# Patient Record
Sex: Male | Born: 1940 | Race: Black or African American | Hispanic: No | Marital: Married | Smoking: Never smoker
Health system: Southern US, Community
[De-identification: ages and names within clinical notes are randomized; demographics above are authoritative.]

## PROBLEM LIST (undated history)

## (undated) DIAGNOSIS — N529 Male erectile dysfunction, unspecified: Secondary | ICD-10-CM

## (undated) DIAGNOSIS — Z950 Presence of cardiac pacemaker: Secondary | ICD-10-CM

## (undated) DIAGNOSIS — R001 Bradycardia, unspecified: Secondary | ICD-10-CM

## (undated) DIAGNOSIS — F329 Major depressive disorder, single episode, unspecified: Secondary | ICD-10-CM

## (undated) DIAGNOSIS — I517 Cardiomegaly: Secondary | ICD-10-CM

## (undated) DIAGNOSIS — I10 Essential (primary) hypertension: Secondary | ICD-10-CM

## (undated) DIAGNOSIS — F3289 Other specified depressive episodes: Secondary | ICD-10-CM

## (undated) DIAGNOSIS — N400 Enlarged prostate without lower urinary tract symptoms: Secondary | ICD-10-CM

## (undated) DIAGNOSIS — Z954 Presence of other heart-valve replacement: Secondary | ICD-10-CM

## (undated) DIAGNOSIS — Z9989 Dependence on other enabling machines and devices: Secondary | ICD-10-CM

## (undated) DIAGNOSIS — E669 Obesity, unspecified: Secondary | ICD-10-CM

## (undated) DIAGNOSIS — J309 Allergic rhinitis, unspecified: Secondary | ICD-10-CM

## (undated) DIAGNOSIS — E785 Hyperlipidemia, unspecified: Secondary | ICD-10-CM

## (undated) DIAGNOSIS — G4733 Obstructive sleep apnea (adult) (pediatric): Secondary | ICD-10-CM

## (undated) DIAGNOSIS — I4891 Unspecified atrial fibrillation: Secondary | ICD-10-CM

## (undated) DIAGNOSIS — R072 Precordial pain: Secondary | ICD-10-CM

## (undated) DIAGNOSIS — K635 Polyp of colon: Secondary | ICD-10-CM

## (undated) DIAGNOSIS — I2510 Atherosclerotic heart disease of native coronary artery without angina pectoris: Secondary | ICD-10-CM

## (undated) DIAGNOSIS — K219 Gastro-esophageal reflux disease without esophagitis: Secondary | ICD-10-CM

## (undated) DIAGNOSIS — E291 Testicular hypofunction: Secondary | ICD-10-CM

## (undated) DIAGNOSIS — M47812 Spondylosis without myelopathy or radiculopathy, cervical region: Secondary | ICD-10-CM

## (undated) DIAGNOSIS — D126 Benign neoplasm of colon, unspecified: Secondary | ICD-10-CM

## (undated) DIAGNOSIS — I441 Atrioventricular block, second degree: Secondary | ICD-10-CM

## (undated) DIAGNOSIS — IMO0001 Reserved for inherently not codable concepts without codable children: Secondary | ICD-10-CM

## (undated) DIAGNOSIS — G4730 Sleep apnea, unspecified: Secondary | ICD-10-CM

## (undated) HISTORY — DX: Dependence on other enabling machines and devices: Z99.89

## (undated) HISTORY — DX: Atrioventricular block, second degree: I44.1

## (undated) HISTORY — DX: Major depressive disorder, single episode, unspecified: F32.9

## (undated) HISTORY — DX: Gastro-esophageal reflux disease without esophagitis: K21.9

## (undated) HISTORY — PX: KNEE ARTHROSCOPY: SUR90

## (undated) HISTORY — DX: Benign neoplasm of colon, unspecified: D12.6

## (undated) HISTORY — DX: Cardiomegaly: I51.7

## (undated) HISTORY — DX: Hyperlipidemia, unspecified: E78.5

## (undated) HISTORY — DX: Obesity, unspecified: E66.9

## (undated) HISTORY — PX: OTHER SURGICAL HISTORY: SHX169

## (undated) HISTORY — DX: Bradycardia, unspecified: R00.1

## (undated) HISTORY — PX: CATARACT EXTRACTION: SUR2

## (undated) HISTORY — DX: Obstructive sleep apnea (adult) (pediatric): G47.33

## (undated) HISTORY — DX: Presence of cardiac pacemaker: Z95.0

## (undated) HISTORY — DX: Spondylosis without myelopathy or radiculopathy, cervical region: M47.812

## (undated) HISTORY — DX: Presence of other heart-valve replacement: Z95.4

## (undated) HISTORY — DX: Reserved for inherently not codable concepts without codable children: IMO0001

## (undated) HISTORY — DX: Sleep apnea, unspecified: G47.30

## (undated) HISTORY — DX: Unspecified atrial fibrillation: I48.91

## (undated) HISTORY — DX: Precordial pain: R07.2

## (undated) HISTORY — DX: Testicular hypofunction: E29.1

## (undated) HISTORY — DX: Atherosclerotic heart disease of native coronary artery without angina pectoris: I25.10

## (undated) HISTORY — DX: Other specified depressive episodes: F32.89

## (undated) HISTORY — DX: Allergic rhinitis, unspecified: J30.9

## (undated) HISTORY — DX: Male erectile dysfunction, unspecified: N52.9

## (undated) HISTORY — DX: Polyp of colon: K63.5

## (undated) HISTORY — DX: Benign prostatic hyperplasia without lower urinary tract symptoms: N40.0

---

## 1988-07-02 HISTORY — PX: CORONARY ARTERY BYPASS GRAFT: SHX141

## 1992-07-02 HISTORY — PX: CORONARY ARTERY BYPASS GRAFT: SHX141

## 1998-06-22 ENCOUNTER — Encounter: Payer: Self-pay | Admitting: Urology

## 1998-06-22 ENCOUNTER — Ambulatory Visit (HOSPITAL_COMMUNITY): Admission: RE | Admit: 1998-06-22 | Discharge: 1998-06-22 | Payer: Self-pay | Admitting: Urology

## 1999-02-22 ENCOUNTER — Ambulatory Visit (HOSPITAL_BASED_OUTPATIENT_CLINIC_OR_DEPARTMENT_OTHER): Admission: RE | Admit: 1999-02-22 | Discharge: 1999-02-22 | Payer: Self-pay | Admitting: Orthopedic Surgery

## 2000-07-08 ENCOUNTER — Ambulatory Visit (HOSPITAL_BASED_OUTPATIENT_CLINIC_OR_DEPARTMENT_OTHER): Admission: RE | Admit: 2000-07-08 | Discharge: 2000-07-08 | Payer: Self-pay | Admitting: Interventional Cardiology

## 2000-10-01 ENCOUNTER — Ambulatory Visit (HOSPITAL_BASED_OUTPATIENT_CLINIC_OR_DEPARTMENT_OTHER): Admission: RE | Admit: 2000-10-01 | Discharge: 2000-10-01 | Payer: Self-pay | Admitting: Internal Medicine

## 2002-10-17 ENCOUNTER — Emergency Department (HOSPITAL_COMMUNITY): Admission: EM | Admit: 2002-10-17 | Discharge: 2002-10-17 | Payer: Self-pay | Admitting: Emergency Medicine

## 2005-04-24 ENCOUNTER — Encounter: Admission: RE | Admit: 2005-04-24 | Discharge: 2005-04-24 | Payer: Self-pay | Admitting: Internal Medicine

## 2006-08-05 ENCOUNTER — Ambulatory Visit (HOSPITAL_COMMUNITY): Admission: RE | Admit: 2006-08-05 | Discharge: 2006-08-05 | Payer: Self-pay | Admitting: Orthopedic Surgery

## 2006-11-29 ENCOUNTER — Encounter: Admission: RE | Admit: 2006-11-29 | Discharge: 2006-11-29 | Payer: Self-pay | Admitting: Internal Medicine

## 2007-02-10 ENCOUNTER — Ambulatory Visit (HOSPITAL_COMMUNITY): Admission: RE | Admit: 2007-02-10 | Discharge: 2007-02-10 | Payer: Self-pay | Admitting: Internal Medicine

## 2007-07-03 DIAGNOSIS — D126 Benign neoplasm of colon, unspecified: Secondary | ICD-10-CM

## 2007-07-03 HISTORY — DX: Benign neoplasm of colon, unspecified: D12.6

## 2008-01-09 ENCOUNTER — Encounter: Admission: RE | Admit: 2008-01-09 | Discharge: 2008-01-09 | Payer: Self-pay | Admitting: Internal Medicine

## 2010-11-17 NOTE — Op Note (Signed)
NAME:  Brent Everett, Brent Everett                ACCOUNT NO.:  [id number]   MEDICAL RECORD NO.:  [id number]          PATIENT TYPE:  AMB   LOCATION:  SDS                          FACILITY:  [facility]   PHYSICIAN:  Harvie Junior, M.D.   DATE OF BIRTH:  [birth year]   DATE OF PROCEDURE:  DATE OF DISCHARGE:  08/05/2006                               OPERATIVE REPORT   PREOPERATIVE DIAGNOSIS:  Medial meniscal tear.   POSTOPERATIVE DIAGNOSES:  1. Medial meniscal tear.  2. 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  3. 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   PRINCIPAL PROCEDURE:  1. Partial posterior medial meniscectomy with corresponding      debridement in the medial femoral compartment and medial      compartment.  2. Debridement of lateral femoral condyle by way of chondroplasty.  3. Debridement of patellofemoral joint by way of chondroplasty.   SURGEON:  Harvie Junior, M.D.   ANESTHESIA:  General.   BRIEF HISTORY:  Mr. Gipe is a 70 year old male with a long history of  having had significant left knee problems.  He was ultimately with a  knee arthroscopy and he did well with this.  He began having right knee  symptoms.  We treated him conservatively with anti-inflammatory  medications, activity modifications, and injection therapy.  All of this  failed.  The patient continued to complain of pain.  He was ultimately  taken to the operating room for operative knee arthroscopy.  He has a  mechanical valve in place and because of this he needed to be  transferred off of Coumadin onto Lovenox preoperatively and this was  accomplished.  He was brought to the operating room for this procedure.   PROCEDURE:  The patient was brought to the operating room, and after  adequate anesthesia was obtained with a general anesthetic, the patient  was placed on the operating table, and the right leg was prepped and  draped in the usual sterile fashion.  Following this routine,  arthroscopic  examination of the knee revealed that there was obvious  posterior arm medial meniscal tear.  This was treated with a partial  posterior arm medial meniscectomy.  Once this was completed, a  probe  was used to probe the posterior meniscus.  The medial femoral condyle  was then debrided to smooth this out and there was some grade II  chondromalacia. Attention was turned over to the lateral compartment  where there was some grade II chondromalacia in the lateral femoral  compartment.  The lateral meniscus was within normal limits.  Back up to  the patellofemoral compartment, there was some grade II and III changes  in the patellofemoral as well as beneath the patella and also in the  trochlear.  At this point the knee was  scoped, thoroughly irrigated, suctioned, and dried off.  The osteal  portals were closed with a bandage.  A sterile compression dressing was  applied and the patient taken to the recovery room, where she was noted  to be in satisfactory condition.  The estimated blood loss for the  procedure was  none.      Harvie Junior, M.D.  Electronically Signed     JLG/MEDQ  D:  08/05/2006  T:  08/06/2006  Job:  [id number]

## 2010-12-05 ENCOUNTER — Inpatient Hospital Stay (HOSPITAL_COMMUNITY): Payer: Medicare Other

## 2010-12-05 ENCOUNTER — Inpatient Hospital Stay (HOSPITAL_COMMUNITY)
Admission: AD | Admit: 2010-12-05 | Discharge: 2010-12-08 | DRG: 244 | Disposition: A | Discharge status: Home / Self Care | Payer: Medicare Other | Source: Ambulatory Visit | Attending: Interventional Cardiology | Admitting: Interventional Cardiology

## 2010-12-05 DIAGNOSIS — G4733 Obstructive sleep apnea (adult) (pediatric): Secondary | ICD-10-CM | POA: Diagnosis present

## 2010-12-05 DIAGNOSIS — E669 Obesity, unspecified: Secondary | ICD-10-CM | POA: Diagnosis present

## 2010-12-05 DIAGNOSIS — I4891 Unspecified atrial fibrillation: Secondary | ICD-10-CM | POA: Diagnosis present

## 2010-12-05 DIAGNOSIS — Z7901 Long term (current) use of anticoagulants: Secondary | ICD-10-CM

## 2010-12-05 DIAGNOSIS — IMO0001 Reserved for inherently not codable concepts without codable children: Secondary | ICD-10-CM | POA: Diagnosis present

## 2010-12-05 DIAGNOSIS — R911 Solitary pulmonary nodule: Secondary | ICD-10-CM

## 2010-12-05 DIAGNOSIS — Z888 Allergy status to other drugs, medicaments and biological substances status: Secondary | ICD-10-CM

## 2010-12-05 DIAGNOSIS — Z882 Allergy status to sulfonamides status: Secondary | ICD-10-CM

## 2010-12-05 DIAGNOSIS — I441 Atrioventricular block, second degree: Principal | ICD-10-CM | POA: Diagnosis present

## 2010-12-05 DIAGNOSIS — Z954 Presence of other heart-valve replacement: Secondary | ICD-10-CM

## 2010-12-05 DIAGNOSIS — I10 Essential (primary) hypertension: Secondary | ICD-10-CM | POA: Diagnosis present

## 2010-12-05 HISTORY — DX: Essential (primary) hypertension: I10

## 2010-12-05 LAB — COMPREHENSIVE METABOLIC PANEL
AST: 18 U/L (ref 0–37)
BUN: 15 mg/dL (ref 6–23)
CO2: 28 mEq/L (ref 19–32)
Chloride: 106 mEq/L (ref 96–112)
Creatinine, Ser: 0.99 mg/dL (ref 0.4–1.5)
GFR calc non Af Amer: >60 mL/min (ref >60)
Glucose, Bld: 96 mg/dL (ref 70–99)
Total Bilirubin: 0.5 mg/dL (ref 0.3–1.2)

## 2010-12-05 LAB — PROTIME-INR
INR: 2.38 — ABNORMAL HIGH (ref 0.00–1.49)
Prothrombin Time: 26.1 seconds — ABNORMAL HIGH (ref 11.6–15.2)

## 2010-12-05 LAB — CBC
Platelets: 158 10*3/uL (ref 150–400)
RBC: 4.82 MIL/uL (ref 4.22–5.81)
WBC: 5.5 10*3/uL (ref 4.0–10.5)

## 2010-12-05 LAB — CARDIAC PANEL(CRET KIN+CKTOT+MB+TROPI): Troponin I: <0.3 ng/mL (ref <0.30)

## 2010-12-05 LAB — PRO B NATRIURETIC PEPTIDE: Pro B Natriuretic peptide (BNP): 177.8 pg/mL — ABNORMAL HIGH (ref 0–125)

## 2010-12-06 ENCOUNTER — Inpatient Hospital Stay (HOSPITAL_COMMUNITY): Payer: Medicare Other

## 2010-12-06 ENCOUNTER — Encounter (HOSPITAL_COMMUNITY): Payer: Self-pay | Admitting: Radiology

## 2010-12-06 DIAGNOSIS — I441 Atrioventricular block, second degree: Secondary | ICD-10-CM

## 2010-12-06 LAB — PROTIME-INR
INR: 2.43 — ABNORMAL HIGH (ref 0.00–1.49)
Prothrombin Time: 26.5 seconds — ABNORMAL HIGH (ref 11.6–15.2)

## 2010-12-06 MED ORDER — IOHEXOL 350 MG/ML SOLN
100.0000 mL | Freq: Once | INTRAVENOUS | Status: AC | PRN
  Administered 2010-12-06: 100 mL via INTRAVENOUS

## 2010-12-07 DIAGNOSIS — I442 Atrioventricular block, complete: Secondary | ICD-10-CM

## 2010-12-07 DIAGNOSIS — Z950 Presence of cardiac pacemaker: Secondary | ICD-10-CM

## 2010-12-07 HISTORY — PX: PACEMAKER INSERTION: SHX728

## 2010-12-07 HISTORY — DX: Presence of cardiac pacemaker: Z95.0

## 2010-12-07 LAB — BASIC METABOLIC PANEL
CO2: 29 mEq/L (ref 19–32)
Chloride: 104 mEq/L (ref 96–112)
Creatinine, Ser: 0.91 mg/dL (ref 0.4–1.5)
GFR calc Af Amer: >60 mL/min (ref >60)

## 2010-12-08 ENCOUNTER — Inpatient Hospital Stay (HOSPITAL_COMMUNITY): Payer: Medicare Other

## 2010-12-08 LAB — BASIC METABOLIC PANEL
GFR calc Af Amer: >60 mL/min (ref >60)
GFR calc non Af Amer: >60 mL/min (ref >60)
Glucose, Bld: 115 mg/dL — ABNORMAL HIGH (ref 70–99)
Potassium: 3.6 mEq/L (ref 3.5–5.1)
Sodium: 141 mEq/L (ref 135–145)

## 2010-12-08 LAB — PROTIME-INR: INR: 1.62 — ABNORMAL HIGH (ref 0.00–1.49)

## 2010-12-12 ENCOUNTER — Other Ambulatory Visit (HOSPITAL_COMMUNITY): Payer: Self-pay | Admitting: Interventional Cardiology

## 2010-12-12 DIAGNOSIS — I7100 Dissection of unspecified site of aorta: Secondary | ICD-10-CM

## 2010-12-21 ENCOUNTER — Ambulatory Visit: Payer: Medicare Other | Admitting: *Deleted

## 2011-01-02 NOTE — Consult Note (Signed)
Brent Everett                ACCOUNT NO.:  [id number]  MEDICAL RECORD NO.:  [id number]  LOCATION:  4709                         FACILITY:  [facility]  PHYSICIAN:  Duke Salvia, MD, FACCDATE OF BIRTH:  02/10/1941  DATE OF CONSULTATION:  12/06/2010 DATE OF DISCHARGE:                                CONSULTATION   Thank you very much for asking [place] to see [name] in consultation for heart block.  He is a morbidly obese 70 year old gentleman with a history of hypertension, obstructive sleep apnea, and aortic dissection for unclear reasons that initially occurred in 1990, and he underwent aortic valve replacement and presumed repair, although these records are not available in 1994.  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  This was apparently confirmed again today.  He had been doing well.  He had seen his primary cardiologist Dr. Katrinka Blazing earlier this week and was found to be in heart block.  He was on carvedilol for blood pressure and because of dissection, and he is admitted to hospital.  His carvedilol was discontinued.  His heart block improved, but has persisted moderately with second-degree heart block and prolonged pauses today.  We were asked to see him to consider pacing given the need for chronic beta-blocker therapy given his dissection.  Previously, normal left ventricular function and that was apparently confirmed today, although the records again are not available.  He has noted over the last couple of weeks episodes of lightheadedness and impaired exercise tolerance.  He has had no peripheral edema.  His past medical history in addition to the above is notable for hypertension, obstructive sleep apnea, depression.  His review of systems is as noted previously.  His past surgical  history is as noted above.  In addition, he has had surgery on his right knee.  SOCIAL HISTORY:  He is married.  He has three children.  He does not use cigarettes or alcohol, uses some marijuana.  PHYSICAL EXAMINATION:  GENERAL:  He is an older African American gentleman appeared his stated age of 53. VITAL SIGNS:  His blood pressure is elevated at 153/107, his pulse is 70.  He is afebrile.  He is in no acute distress. HEENT:  Normal. NECK:  Veins were flat.  His carotids were brisk and full bilaterally without bruits.  The mechanical S2 was audible. BACK:  Without kyphosis or scoliosis. LUNGS:  Clear. HEART:  Sounds were regular with a 2/6 systolic murmur that varied with RR intervals.  There was mechanical S2. ABDOMEN:  Protuberant but soft. EXTREMITIES:  Femoral pulses were not examined.  Distal pulses were trace.  There was no clubbing, cyanosis, or edema. NEUROLOGIC:  Grossly normal. SKIN:  Warm and dry.  Electrocardiogram dated this morning demonstrated sinus rhythm at 69 with intervals of 0.24/0.10/0.37.  There is poor R-wave progression and evidence of septal infarct.  Electrocardiogram from yesterday demonstrated a ventricular escape rhythm at approximately 35 beats per minute with retrograde VA conduction.  There was an isolated sinus beat interpolated  there although may have been a fusion beat retrospectively with a right bundle- branch block escape beat.  Other laboratories notable for an INR of 2.43, a TSH that was normal, cardiac enzymes that were negative. Electrolytes were notable for only a mild depletion of potassium at 3.4. His BNP was mildly elevated at 177.  This may be artifactually low given his obesity.  IMPRESSION: 1. High-grade heart block with some improvement following     discontinuation of his carvedilol. 2. History of aortic valve replacement and aortic dissection with     chronic dissection requiring ongoing beta-blocker therapy. 3. Report by  imaging of bypass surgery, although these records are not     available. 4. Presumed normal left ventricular function, records pending. 5. Obstructive sleep apnea. 6. Morbid obesity. 7. Resistant hypertension. 8. Marijuana use.  Brent Everett required pacing because of his need for beta-blocker therapy. I have reviewed with him the potential benefits and risks including, but not limited to death, perforation, infection, and lead dislodgement.  He understands these risks and is willing to proceed.  In addition, I have suggested that he consider refraining from marijuana use.  It is associated with sympathetic stimulation, which is potentially problematic and with his history of aortic dissection.  The other issue I wonder is given the young age, which is aortic dissection presented is whether reevaluation as to the cause of his aortopathy is worth considering.  It may just be hypertension.  We will plan to undertake pacing tomorrow; this is dictated at 1700 hours.  We are going to do it earlier today, but he ended up having need for urgent CT scanning.     Duke Salvia, MD, [facility]     SCK/MEDQ  D:  12/06/2010  T:  12/07/2010  Job:  [id number]  Electronically Signed by Sherryl Manges MD [facility] on 01/02/2011 04:30:42 PM

## 2011-01-02 NOTE — Op Note (Signed)
  NAMESTEPHENS, SHREVE                ACCOUNT NO.:  [id number]  MEDICAL RECORD NO.:  [id number]  LOCATION:  4709                         FACILITY:  [facility]  PHYSICIAN:  Duke Salvia, MD, FACCDATE OF BIRTH:  Mar 11, 1941  DATE OF PROCEDURE:  12/07/2010 DATE OF DISCHARGE:                              OPERATIVE REPORT   PREOPERATIVE DIAGNOSIS:  Complete heart block.  POSTOPERATIVE DIAGNOSIS:  Complete heart block.  PROCEDURE:  Dual-chamber pacemaker implantation.  Following obtaining informed consent, the patient was brought to the electrophysiology laboratory and placed on the fluoroscopic table in supine position.  After routine prep and drape of the left upper chest, lidocaine was infiltrated in the prepectoral subclavicular region and carried down to the layer of the prepectoral fascia using electrocautery and sharp dissection.  A pocket was formed similarly.  Hemostasis was obtained.  Thereafter, attention was turned to gaining access to the extrathoracic left subclavian vein which was accomplished without difficulty without the aspiration of air or puncture of the artery.  Two separate venipunctures were accomplished.  Guidewires were placed and retained and sequentially 7-French sheaths were placed which were passed a St. Jude 2088 TC, 58-cm active-fixation ventricular lead, serial number [id number] and a 2088 TC, 52-cm active-fixation atrial lead, serial number [id number].  Under fluoroscopic guidance, these were manipulated to the right ventricular apex and the right atrial appendage respectively where the bipolar R-wave was 19.6 with a pace impedance of 540, threshold of 1 V at 0.4, current threshold is 1.8 MA.  There was no diaphragmatic pacing at 10 V and the current of injury was modest.  Bipolar P-wave was 3.4 with a pace impedance of 431, a threshold of 1.5 at 0.4, current of threshold was 3.4 mA and a current of injury was modest.  There was no diaphragmatic pacing  at 10 volts.  The ventricular lead was marked with a tie prior to inserting the atrial lead.  These leads were secured to the prepectoral fascia and then attached to a St. Jude Accent DRRF pulse generator, model number PM 2210, serial number [id number].  Parameters were checked remotely via the wireless system and they were stable.  The leads of the pulse generator were placed in the pocket and secured to the prepectoral fascia.  The wound was closed in 3 layers in normal fashion.  The wound was washed, dried, and Benzoin and Steri-Strip dressing was applied.  Needle counts, sponge counts, and instrument counts were correct at the end of the procedure according to the staff.  The patient tolerated the procedure without apparent complication.     Duke Salvia, MD, [facility]     SCK/MEDQ  D:  12/07/2010  T:  12/08/2010  Job:  [id number]  cc:   Lyn Records, M.D.  Electronically Signed by Sherryl Manges MD [facility] on 01/02/2011 04:30:45 PM

## 2011-01-04 NOTE — Discharge Summary (Signed)
NAMESHOUA, RESSLER                ACCOUNT NO.:  [id number]  MEDICAL RECORD NO.:  [id number]  LOCATION:  4709                         FACILITY:  [facility]  PHYSICIAN:  Lyn Records, M.D.   DATE OF BIRTH:  1941-02-09  DATE OF ADMISSION:  12/05/2010 DATE OF DISCHARGE:  12/08/2010                              DISCHARGE SUMMARY   REASON FOR ADMISSION:  Transient episodes of high-grade AV block, asymptomatic.  DISCHARGE DIAGNOSES: 1. 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     a.     DDD pacemaker implantation, December 07, 2010, Dr. Sherryl Manges. 2. History of mechanical aortic valve placement at the time of aortic     root replacement during acute aortic dissection. 3. Aortic dissection prior to 2000. 4. Obesity. 5. Sleep apnea. 6. History of atrial dysrhythmia with frequent premature atrial     contractions and atrial bigeminy. 7. Chronic anticoagulation therapy for aortic valve. 8. Chest pain on admission and felt to represent musculoskeletal pain.  PROCEDURES PERFORMED:  DDD pacemaker, December 07, 2010.  Chest and abdomen CT angio, December 06, 2010.  A 2-D Doppler echocardiogram, December 06, 2010.  DISCHARGE INSTRUCTIONS: 1. Follow up with Dr. Verdis Prime on December 27, 2010, at 9:00 a.m. [facility]     Cardiology. 2. Dr. Sherryl Manges in 2 months for exercise treadmill test and     pacemaker reprogramming.  MEDICATIONS: 1. Carvedilol 25 mg b.i.d. 2. Cymbalta 60 mg daily. 3. Diovan 320 mg daily. 4. Doxazosin 80 mg daily. 5. Eplerenone 25 mg daily. 6. Finasteride 5 mg daily. 7. Hydrochlorothiazide 25 mg daily. 8. Lipitor 40 mg daily. 9. Norvasc 10 mg daily. 10.Potassium chloride 20 mEq daily. 11.Prilosec 20 mg daily. 12.Soma 350 mg 3 times a day as needed for pain. 13.Warfarin 5 mg tablets with current schedule of 10 mg daily except     7.5 mg on Sunday, Monday, Tuesday, Wednesday, Friday and Saturday;     or in other words 10 mg is taken only on Thursdays. I have  requested that he limit the amount of Motrin that is used.  ACTIVITIES:  Per the instruction of Dr. Graciela Husbands with reference to arm movement until pacemaker leads are secure.  He is cautioned to call if chills, fever, or swelling at the pacemaker insertion site.  HISTORY AND PHYSICAL AND HOSPITAL COURSE:  Please see the office history and physical.  The patient was seen in consultation by Dr. Sherryl Manges.  He agreed that the patient had second-degree heart block and at times complete heart block, although he was asymptomatic.  The patient accepted proceeding with DDD pacemaker insertion which was performed on December 07, 2010.  Because of some vague chest pain on admission, a chest CT and abdominal CT were performed to rule out change in aortic anatomy given the patient's history of dissection.  No significant change was noted with the exception of a right lung nodule.  The right lung nodule will need to be followed up with CT in 4-6 months.  I have discussed this with the patient.  Please note that THE PATIENT HAS A RIGHT LUNG LOWER LOBE GROUND-GLASS NODULE THAT NEEDS FOLLOW UP IN  4-6 MONTHS.     Lyn Records, M.D.     HWS/MEDQ  D:  12/08/2010  T:  12/09/2010  Job:  [id number]  cc:   Duke Salvia, MD, [facility]  Electronically Signed by Verdis Prime M.D. on 01/04/2011 01:36:30 PM

## 2011-04-16 LAB — ALDOSTERONE + RENIN ACTIVITY W/ RATIO: Renin Activity: 0.5

## 2011-04-16 LAB — BASIC METABOLIC PANEL
BUN: 11
BUN: 13
CO2: 31
Calcium: 9.5
Calcium: 9.9
Chloride: 110
Creatinine, Ser: 0.73
Creatinine, Ser: 0.89
GFR calc non Af Amer: >60
Glucose, Bld: 89

## 2011-05-15 ENCOUNTER — Ambulatory Visit (HOSPITAL_COMMUNITY): Payer: Medicare Other

## 2012-11-03 ENCOUNTER — Other Ambulatory Visit: Payer: Self-pay | Admitting: Interventional Cardiology

## 2012-11-03 DIAGNOSIS — R072 Precordial pain: Secondary | ICD-10-CM

## 2012-11-03 DIAGNOSIS — I712 Thoracic aortic aneurysm, without rupture, unspecified: Secondary | ICD-10-CM

## 2012-11-03 DIAGNOSIS — I2510 Atherosclerotic heart disease of native coronary artery without angina pectoris: Secondary | ICD-10-CM

## 2012-11-04 ENCOUNTER — Ambulatory Visit
Admission: RE | Admit: 2012-11-04 | Discharge: 2012-11-04 | Disposition: A | Discharge status: Home / Self Care | Payer: Medicare Other | Source: Ambulatory Visit | Attending: Interventional Cardiology | Admitting: Interventional Cardiology

## 2012-11-04 DIAGNOSIS — I2510 Atherosclerotic heart disease of native coronary artery without angina pectoris: Secondary | ICD-10-CM

## 2012-11-04 DIAGNOSIS — R072 Precordial pain: Secondary | ICD-10-CM

## 2012-11-04 DIAGNOSIS — I712 Thoracic aortic aneurysm, without rupture, unspecified: Secondary | ICD-10-CM

## 2012-11-04 MED ORDER — IOHEXOL 350 MG/ML SOLN
80.0000 mL | Freq: Once | INTRAVENOUS | Status: AC | PRN
  Administered 2012-11-04: 80 mL via INTRAVENOUS

## 2013-04-13 ENCOUNTER — Other Ambulatory Visit: Payer: Self-pay | Admitting: Interventional Cardiology

## 2013-05-13 ENCOUNTER — Telehealth: Payer: Self-pay | Admitting: Interventional Cardiology

## 2013-05-13 NOTE — Telephone Encounter (Signed)
New message    For Brent Everett PCP presc vimovo 500/20 for neck stiffness possible arthritis----Ca he take this with his coumadin?

## 2013-05-13 NOTE — Telephone Encounter (Signed)
Patient instructed to avoid NSAID medications while one Coumadin.  Vimovo has naproxen in it.  He will speak with his PCP about something else, for instance tramadol.  He gets his coumadin checked in [place] where he sees Dr. Cheron Schaumann.  He had INR checked recently and it was therapeutic.

## 2013-05-18 ENCOUNTER — Other Ambulatory Visit: Payer: Self-pay | Admitting: Interventional Cardiology

## 2013-05-21 ENCOUNTER — Telehealth: Payer: Self-pay | Admitting: *Deleted

## 2013-05-21 NOTE — Telephone Encounter (Signed)
Pt calling to request reminder when next appt with Dr. Katrinka Blazing is. He is aware we will mail him a reminder to call and make an appointment 2 months prior to appt. Due date [name] RN

## 2013-05-28 ENCOUNTER — Other Ambulatory Visit: Payer: Self-pay | Admitting: Interventional Cardiology

## 2013-05-31 ENCOUNTER — Other Ambulatory Visit: Payer: Self-pay | Admitting: Interventional Cardiology

## 2013-06-01 ENCOUNTER — Telehealth: Payer: Self-pay | Admitting: Interventional Cardiology

## 2013-06-01 MED ORDER — WARFARIN SODIUM 5 MG PO TABS: ORAL_TABLET | ORAL | Status: DC

## 2013-06-01 NOTE — Telephone Encounter (Signed)
New message     Want Brent Everett to know he is no longer seeing PCP in [place] and he is out of coumadin.  The pharmacy will be calling us to refill his coumadin.  Until further notice, he will be coming back here to get his PT/INR.  He want to start seeing Dr Kirby Funk at [facility] but do not know if he will take him.  He said he will explain everything when he sees you.

## 2013-06-01 NOTE — Telephone Encounter (Signed)
Last INR was a few weeks ago at PCP (2.8).  He is stable on warfarin 7.5 mg qd (using 5 mg pills).  He will see me in 2 weeks.  Refill for warfarin sent to pharmacy. To Dr. Katrinka Blazing as [place] Childes.

## 2013-06-16 ENCOUNTER — Other Ambulatory Visit: Payer: Self-pay | Admitting: Interventional Cardiology

## 2013-06-18 ENCOUNTER — Ambulatory Visit (INDEPENDENT_AMBULATORY_CARE_PROVIDER_SITE_OTHER): Payer: Medicare Other | Admitting: *Deleted

## 2013-06-18 ENCOUNTER — Encounter (INDEPENDENT_AMBULATORY_CARE_PROVIDER_SITE_OTHER): Payer: Self-pay

## 2013-06-18 DIAGNOSIS — Z7901 Long term (current) use of anticoagulants: Secondary | ICD-10-CM

## 2013-06-18 DIAGNOSIS — Z954 Presence of other heart-valve replacement: Secondary | ICD-10-CM

## 2013-06-18 DIAGNOSIS — Z952 Presence of prosthetic heart valve: Secondary | ICD-10-CM | POA: Insufficient documentation

## 2013-06-18 DIAGNOSIS — I4891 Unspecified atrial fibrillation: Secondary | ICD-10-CM

## 2013-06-22 ENCOUNTER — Telehealth: Payer: Self-pay

## 2013-07-01 NOTE — Telephone Encounter (Signed)
refill 

## 2013-07-14 ENCOUNTER — Telehealth: Payer: Self-pay | Admitting: Interventional Cardiology

## 2013-07-14 MED ORDER — AMOXICILLIN 500 MG PO CAPS: 500.0000 mg | ORAL_CAPSULE | Freq: Every day | ORAL | Status: DC

## 2013-07-14 NOTE — Telephone Encounter (Signed)
New message     Have dentist appt this afternoon---will he need to be pre-medicated?-----If yes  [facility]   [phone].  Pls call pt and let him know yes or no.

## 2013-07-14 NOTE — Telephone Encounter (Signed)
returned pt call.pt given Brent Rubin G.,NP instructions to tale 2g of amoxicillin today before dental appt.pt verbalized understanding and agreeable with plan

## 2013-07-21 ENCOUNTER — Telehealth: Payer: Self-pay | Admitting: Interventional Cardiology

## 2013-07-21 NOTE — Telephone Encounter (Signed)
New message   Patient calling has question regarding medication eplerenone 25 mg making sure he not taken for CHF. Only blood pressure. Please advise.

## 2013-07-21 NOTE — Telephone Encounter (Signed)
Follow Up:  Pt is still waiting on a call from [place]. Pt states he needs to know about a medication. States it is for possible insurance coverage.

## 2013-07-22 NOTE — Telephone Encounter (Signed)
lmom.pt prescribed Inspra under htn diagnosis.pt can call back with any additional questions.

## 2013-07-22 NOTE — Telephone Encounter (Signed)
Follow up    Please fax note staying pt is taking eplerenone for bp only and not for chf---fax to [phone]  Attn mr holt.  This is for life ins purposes

## 2013-07-23 NOTE — Telephone Encounter (Signed)
Pt was told dr/cma are out of office today and will return tomorrow. Pt needs a call back. Pt was agreeable to plan.

## 2013-07-23 NOTE — Telephone Encounter (Signed)
Follow up     Want to know if Dr Tamala Julian has faxed the note regarding eplerenone.  Pls call pt

## 2013-07-23 NOTE — Telephone Encounter (Signed)
He is taking Inspra /Eplerenone for control of BP and hypokalemia.

## 2013-07-24 ENCOUNTER — Telehealth: Payer: Self-pay | Admitting: *Deleted

## 2013-07-24 NOTE — Telephone Encounter (Signed)
Do you know anything about a note that Dr Tamala Julian was to sign for this patient about some of his meds. Please fax to [phone] as soon as possible. Thanks, MI

## 2013-07-27 ENCOUNTER — Telehealth: Payer: Self-pay | Admitting: *Deleted

## 2013-07-27 NOTE — Telephone Encounter (Signed)
tried to return call to Hassan Rowan from pt ins.letter faxed to fax [phone] transmission ok

## 2013-07-27 NOTE — Telephone Encounter (Signed)
Hassan Rowan called again about the letter that was going to be written and faxed in for the patient. If you need to call her for any reason she can be reached at [phone]. Thanks, MI

## 2013-07-30 ENCOUNTER — Ambulatory Visit (INDEPENDENT_AMBULATORY_CARE_PROVIDER_SITE_OTHER): Payer: Medicare Other | Admitting: Internal Medicine

## 2013-07-30 ENCOUNTER — Ambulatory Visit (INDEPENDENT_AMBULATORY_CARE_PROVIDER_SITE_OTHER): Payer: Medicare Other | Admitting: Pharmacist

## 2013-07-30 ENCOUNTER — Encounter: Payer: Self-pay | Admitting: Internal Medicine

## 2013-07-30 VITALS — BP 138/86 | HR 77 | Ht 73.0 in | Wt 308.6 lb

## 2013-07-30 DIAGNOSIS — I4891 Unspecified atrial fibrillation: Secondary | ICD-10-CM

## 2013-07-30 DIAGNOSIS — Z954 Presence of other heart-valve replacement: Secondary | ICD-10-CM

## 2013-07-30 DIAGNOSIS — Z5181 Encounter for therapeutic drug level monitoring: Secondary | ICD-10-CM

## 2013-07-30 DIAGNOSIS — Z950 Presence of cardiac pacemaker: Secondary | ICD-10-CM

## 2013-07-30 DIAGNOSIS — Z7901 Long term (current) use of anticoagulants: Secondary | ICD-10-CM

## 2013-07-30 LAB — MDC_IDC_ENUM_SESS_TYPE_INCLINIC
Battery Remaining Longevity: 93.6 mo
Brady Statistic RV Percent Paced: 99 %
Date Time Interrogation Session: 20150129161833
Implantable Pulse Generator Serial Number: 7255840
Lead Channel Impedance Value: 400 Ohm
Lead Channel Pacing Threshold Amplitude: 0.875 V
Lead Channel Sensing Intrinsic Amplitude: 11.8 mV
Lead Channel Setting Sensing Sensitivity: 2 mV
MDC IDC MSMT BATTERY VOLTAGE: 2.95 V
MDC IDC MSMT LEADCHNL RA IMPEDANCE VALUE: 400 Ohm
MDC IDC MSMT LEADCHNL RA SENSING INTR AMPL: 1.4 mV
MDC IDC MSMT LEADCHNL RV PACING THRESHOLD PULSEWIDTH: 0.4 ms
MDC IDC SET LEADCHNL RA PACING AMPLITUDE: 2 V
MDC IDC SET LEADCHNL RV PACING AMPLITUDE: 1.125
MDC IDC SET LEADCHNL RV PACING PULSEWIDTH: 0.4 ms
MDC IDC STAT BRADY RA PERCENT PACED: 0.02 %

## 2013-07-30 LAB — POCT INR: INR: 3.9

## 2013-07-30 NOTE — Progress Notes (Signed)
HPI  [name] is referred today by Dr. Tamala Julian for ongoing evaluation and management of his PPM. The patient is a very pleasant 73 year old man with multiple medical problems. In 1990, he sustained an aortic dissection and underwent emergency surgery. In 1994, he underwent bypass surgery.  In 2012, he underwent insertion of a dual-chamber pacemaker secondary to symptomatic bradycardia do to sinus node dysfunction and  High-grade heart block. The patient is now developed atrial fibrillation with a very controlled ventricular response. He is pacing the ventricle 99% of the time. He denies syncope. He is a very sedentary lifestyle, and watches TV most of the day. He is not exercising, but is thinking about starting back exercising. He has shortness of breath when he bends over. He admits to dietary indiscretion  And has gained over 100 pounds. Allergies  Allergen Reactions  . Codeine Itching  . Lotensin [Benazepril Hcl]   . Other     Vivax  . Sulfa Antibiotics      Current Outpatient Prescriptions  Medication Sig Dispense Refill  . amLODipine (NORVASC) 10 MG tablet Take 10 mg by mouth daily.      Marland Kitchen atorvastatin (LIPITOR) 40 MG tablet Take 40 mg by mouth daily.      . carvedilol (COREG) 25 MG tablet TAKE 1 TABLET BY MOUTH TWICE A DAY  60 tablet  6  . doxazosin (CARDURA) 8 MG tablet Take 8 mg by mouth daily.      . DULoxetine (CYMBALTA) 60 MG capsule Take 60 mg by mouth daily.      Marland Kitchen eplerenone (INSPRA) 25 MG tablet take 1 tablet by mouth once daily  30 tablet  5  . furosemide (LASIX) 40 MG tablet take 1 tablet by mouth once daily  90 tablet  1  . omeprazole (PRILOSEC OTC) 20 MG tablet Take 20 mg by mouth as needed.      . potassium chloride SA (K-DUR,KLOR-CON) 20 MEQ tablet TAKE 1 TABLET BY MOUTH TWICE A DAY  60 tablet  5  . valsartan (DIOVAN) 320 MG tablet Take 320 mg by mouth daily.      Marland Kitchen warfarin (COUMADIN) 5 MG tablet Take as directed by [facility].       No current  facility-administered medications for this visit.     Past Medical History  Diagnosis Date  . Hypertension   . Heart valve replaced by other means     Normal function  . Obesity, unspecified   . Depressive disorder, not elsewhere classified   . Allergic rhinitis   . Hyperlipidemia   . Esophageal reflux   . Unspecified sleep apnea   . Hypogonadism male   . ED (erectile dysfunction)   . DJD (degenerative joint disease) of cervical spine   . BPH (benign prostatic hyperplasia)   . Colon polyp   . Bradycardia     Severe  . 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 Caryl Comes  . Thoracic aneurysm   . Precordial pain     R/O aortic aneurysm enlargement  . Coronary atherosclerosis of native coronary artery     Stable w/out angina  . CAD (coronary artery disease)     Signs of mild volume overload  . OSA on CPAP   . Colon adenoma 2009  . Second degree AV block     With asymptomatic severe bradycardia  . Atrial fibrillation     Asymptomatic. Continuous A fib on pacer check 10/02/11.  Marland Kitchen  Atrial enlargement, left     Severe, by ECHO 12/31/11  . Right atrial enlargement     Moderate, by ECHO 12/31/11    ROS:   All systems reviewed and negative except as noted in the HPI.   Past Surgical History  Procedure Laterality Date  . Pacemaker insertion  12/07/10    DDD pacemaker, Dr. Caryl Comes  . Other surgical history      Aortic dissection with repair, AV resuspension, and CABG to LAD and RCA in 1990. AVR with St. Jude and LIMA to LAD in 1994.  . Coronary artery bypass graft  1990    LAD to RCA   . Coronary artery bypass graft  1994    AVR with St. Jude and LIMA to LAD     Family History  Problem Relation Age of Onset  . Cancer Mother     pancreatic  . Cancer Maternal Grandmother   . Heart attack Maternal Grandfather   . Heart attack Paternal Grandfather   . Migraines Daughter      History   Social History  . Marital Status: Married    Spouse Name: N/A    Number of  Children: N/A  . Years of Education: N/A   Occupational History  . Not on file.   Social History Main Topics  . Smoking status: Never Smoker   . Smokeless tobacco: Not on file  . Alcohol Use: Yes     Comment: Occasional, one fifth of liquor last 1.5 months, beer infrequently  . Drug Use: Not on file  . Sexual Activity: Not on file   Other Topics Concern  . Not on file   Social History Narrative  . No narrative on file     BP 138/86  Pulse 77  Ht 6\' 1"  (1.854 m)  Wt 308 lb 9.6 oz (139.98 kg)  BMI 40.72 kg/m2  Physical Exam:  Well appearing NAD HEENT: Unremarkable Neck:  No JVD, no thyromegally Back:  No CVA tenderness Lungs:  Clear with no wheezes, rales, or rhonchi. HEART:  Regular rate rhythm, 2/6 systolic murmurs, no rubs, no clicks, mechanical S2 Abd:  soft, positive bowel sounds, no organomegally, no rebound, no guarding Ext:  2 plus pulses, no edema, no cyanosis, no clubbing Skin:  No rashes no nodules Neuro:  CN II through XII intact, motor grossly intact  DEVICE  Normal device function.  See PaceArt for details.   Assess/Plan:

## 2013-07-30 NOTE — Assessment & Plan Note (Signed)
His St. Jude dual-chamber pacemaker is working normally, and we will followup in several months.

## 2013-07-30 NOTE — Assessment & Plan Note (Signed)
He is anticoagulated and his ventricular rate is well controlled secondary to underlying heart block. No change in medical therapy.

## 2013-08-03 NOTE — Telephone Encounter (Signed)
The letter dated 07-24-13 needs to have more info, needs added that  Brent Everett has not been diagnosed or treated for CHF  And resend letter fax  Att Brent. Ernestine Mcmurray [phone] or phone [phone]

## 2013-08-04 NOTE — Telephone Encounter (Signed)
Brent Everett office calling re request for additional info needed on 07-24-13 letter, needs this asap, can it be done today??

## 2013-08-04 NOTE — Telephone Encounter (Signed)
I called Mr Brent Everett office/ they need additional statement added as stated below ASAP, made them aware he will be back in office Thursday 08/06/13. They are requesting it be done that morning so the insurance can be reversed. I told her that I will forward to Dr/Cma.

## 2013-08-06 NOTE — Telephone Encounter (Signed)
Letter faxed attn: Mr.Mejan fax [phone]

## 2013-08-17 ENCOUNTER — Encounter: Payer: Self-pay | Admitting: Interventional Cardiology

## 2013-08-26 ENCOUNTER — Ambulatory Visit (INDEPENDENT_AMBULATORY_CARE_PROVIDER_SITE_OTHER): Payer: 59 | Admitting: *Deleted

## 2013-08-26 DIAGNOSIS — I4891 Unspecified atrial fibrillation: Secondary | ICD-10-CM

## 2013-08-26 DIAGNOSIS — Z7901 Long term (current) use of anticoagulants: Secondary | ICD-10-CM

## 2013-08-26 DIAGNOSIS — Z954 Presence of other heart-valve replacement: Secondary | ICD-10-CM

## 2013-08-26 DIAGNOSIS — Z5181 Encounter for therapeutic drug level monitoring: Secondary | ICD-10-CM

## 2013-08-26 LAB — POCT INR: INR: 2.8

## 2013-09-07 ENCOUNTER — Telehealth: Payer: Self-pay | Admitting: Interventional Cardiology

## 2013-09-07 ENCOUNTER — Other Ambulatory Visit: Payer: Self-pay | Admitting: Pharmacist

## 2013-09-07 MED ORDER — WARFARIN SODIUM 5 MG PO TABS: ORAL_TABLET | ORAL | Status: DC

## 2013-09-23 ENCOUNTER — Ambulatory Visit (INDEPENDENT_AMBULATORY_CARE_PROVIDER_SITE_OTHER): Payer: 59 | Admitting: *Deleted

## 2013-09-23 DIAGNOSIS — I4891 Unspecified atrial fibrillation: Secondary | ICD-10-CM

## 2013-09-23 DIAGNOSIS — Z954 Presence of other heart-valve replacement: Secondary | ICD-10-CM

## 2013-09-23 DIAGNOSIS — Z5181 Encounter for therapeutic drug level monitoring: Secondary | ICD-10-CM

## 2013-09-23 DIAGNOSIS — Z7901 Long term (current) use of anticoagulants: Secondary | ICD-10-CM

## 2013-09-23 LAB — POCT INR: INR: 3

## 2013-10-07 ENCOUNTER — Other Ambulatory Visit: Payer: Self-pay | Admitting: Cardiology

## 2013-10-07 MED ORDER — FUROSEMIDE 40 MG PO TABS: ORAL_TABLET | ORAL | Status: DC

## 2013-10-07 NOTE — Telephone Encounter (Signed)
Refilled

## 2013-10-21 ENCOUNTER — Ambulatory Visit (INDEPENDENT_AMBULATORY_CARE_PROVIDER_SITE_OTHER): Payer: 59 | Admitting: *Deleted

## 2013-10-21 DIAGNOSIS — Z954 Presence of other heart-valve replacement: Secondary | ICD-10-CM

## 2013-10-21 DIAGNOSIS — I4891 Unspecified atrial fibrillation: Secondary | ICD-10-CM

## 2013-10-21 DIAGNOSIS — Z7901 Long term (current) use of anticoagulants: Secondary | ICD-10-CM

## 2013-10-21 DIAGNOSIS — Z5181 Encounter for therapeutic drug level monitoring: Secondary | ICD-10-CM

## 2013-10-21 LAB — POCT INR: INR: 2.9

## 2013-11-03 ENCOUNTER — Encounter: Payer: Self-pay | Admitting: Internal Medicine

## 2013-11-03 ENCOUNTER — Ambulatory Visit (INDEPENDENT_AMBULATORY_CARE_PROVIDER_SITE_OTHER): Payer: 59 | Admitting: *Deleted

## 2013-11-03 DIAGNOSIS — I442 Atrioventricular block, complete: Secondary | ICD-10-CM

## 2013-11-03 LAB — MDC_IDC_ENUM_SESS_TYPE_REMOTE
Battery Remaining Longevity: 95 mo
Battery Voltage: 2.95 V
Brady Statistic AP VP Percent: 0 %
Brady Statistic AS VS Percent: 0 %
Implantable Pulse Generator Model: 2210
Lead Channel Impedance Value: 400 Ohm
Lead Channel Pacing Threshold Amplitude: 0.875 V
Lead Channel Pacing Threshold Pulse Width: 0.4 ms
Lead Channel Pacing Threshold Pulse Width: 0.4 ms
Lead Channel Sensing Intrinsic Amplitude: 12 mV
Lead Channel Setting Pacing Amplitude: 1.125
Lead Channel Setting Pacing Amplitude: 2 V
MDC IDC MSMT LEADCHNL RA PACING THRESHOLD AMPLITUDE: 0.75 V
MDC IDC MSMT LEADCHNL RA SENSING INTR AMPL: 1.4 mV
MDC IDC MSMT LEADCHNL RV IMPEDANCE VALUE: 440 Ohm
MDC IDC PG SERIAL: 7255840
MDC IDC SESS DTM: 20150505075728
MDC IDC SET LEADCHNL RV PACING PULSEWIDTH: 0.4 ms
MDC IDC SET LEADCHNL RV SENSING SENSITIVITY: 2 mV
MDC IDC STAT BRADY AP VS PERCENT: 0 %
MDC IDC STAT BRADY AS VP PERCENT: 0 %
MDC IDC STAT BRADY RA PERCENT PACED: 1 %
MDC IDC STAT BRADY RV PERCENT PACED: 95 %

## 2013-11-12 ENCOUNTER — Encounter: Payer: Self-pay | Admitting: Cardiology

## 2013-11-13 NOTE — Progress Notes (Signed)
Remote pacemaker transmission.   

## 2013-11-26 ENCOUNTER — Other Ambulatory Visit: Payer: Self-pay

## 2013-11-26 MED ORDER — POTASSIUM CHLORIDE CRYS ER 20 MEQ PO TBCR: EXTENDED_RELEASE_TABLET | ORAL | Status: DC

## 2013-12-23 ENCOUNTER — Telehealth: Payer: Self-pay

## 2013-12-23 MED ORDER — FUROSEMIDE 40 MG PO TABS: ORAL_TABLET | ORAL | Status: DC

## 2013-12-23 NOTE — Telephone Encounter (Signed)
Refilled

## 2013-12-27 ENCOUNTER — Other Ambulatory Visit: Payer: Self-pay | Admitting: Interventional Cardiology

## 2013-12-28 ENCOUNTER — Other Ambulatory Visit: Payer: Self-pay | Admitting: *Deleted

## 2013-12-28 MED ORDER — WARFARIN SODIUM 5 MG PO TABS: ORAL_TABLET | ORAL | Status: DC

## 2013-12-31 ENCOUNTER — Ambulatory Visit (INDEPENDENT_AMBULATORY_CARE_PROVIDER_SITE_OTHER): Payer: 59 | Admitting: *Deleted

## 2013-12-31 DIAGNOSIS — I4891 Unspecified atrial fibrillation: Secondary | ICD-10-CM

## 2013-12-31 DIAGNOSIS — Z7901 Long term (current) use of anticoagulants: Secondary | ICD-10-CM

## 2013-12-31 DIAGNOSIS — Z5181 Encounter for therapeutic drug level monitoring: Secondary | ICD-10-CM

## 2013-12-31 DIAGNOSIS — Z954 Presence of other heart-valve replacement: Secondary | ICD-10-CM

## 2013-12-31 LAB — POCT INR: INR: 2.9

## 2014-01-14 ENCOUNTER — Telehealth: Payer: Self-pay

## 2014-01-14 MED ORDER — WARFARIN SODIUM 5 MG PO TABS: ORAL_TABLET | ORAL | Status: DC

## 2014-01-14 NOTE — Telephone Encounter (Signed)
Rx sent 

## 2014-02-04 ENCOUNTER — Encounter: Payer: Self-pay | Admitting: Internal Medicine

## 2014-02-04 ENCOUNTER — Ambulatory Visit (INDEPENDENT_AMBULATORY_CARE_PROVIDER_SITE_OTHER): Payer: Medicare Other | Admitting: *Deleted

## 2014-02-04 DIAGNOSIS — I442 Atrioventricular block, complete: Secondary | ICD-10-CM

## 2014-02-04 LAB — MDC_IDC_ENUM_SESS_TYPE_REMOTE
Battery Remaining Longevity: 95 mo
Battery Voltage: 2.95 V
Brady Statistic AS VP Percent: 0 %
Brady Statistic RA Percent Paced: 1 %
Date Time Interrogation Session: 20150806063459
Lead Channel Impedance Value: 410 Ohm
Lead Channel Impedance Value: 410 Ohm
Lead Channel Pacing Threshold Amplitude: 0.75 V
Lead Channel Pacing Threshold Pulse Width: 0.4 ms
Lead Channel Sensing Intrinsic Amplitude: 1.4 mV
Lead Channel Setting Pacing Amplitude: 1 V
Lead Channel Setting Pacing Pulse Width: 0.4 ms
Lead Channel Setting Sensing Sensitivity: 2 mV
MDC IDC MSMT BATTERY REMAINING PERCENTAGE: 74 %
MDC IDC MSMT LEADCHNL RA PACING THRESHOLD PULSEWIDTH: 0.4 ms
MDC IDC MSMT LEADCHNL RV PACING THRESHOLD AMPLITUDE: 0.75 V
MDC IDC MSMT LEADCHNL RV SENSING INTR AMPL: 12 mV
MDC IDC PG SERIAL: 7255840
MDC IDC SET LEADCHNL RA PACING AMPLITUDE: 2 V
MDC IDC STAT BRADY AP VP PERCENT: 0 %
MDC IDC STAT BRADY AP VS PERCENT: 0 %
MDC IDC STAT BRADY AS VS PERCENT: 0 %
MDC IDC STAT BRADY RV PERCENT PACED: 96 %

## 2014-02-04 NOTE — Progress Notes (Signed)
Remote pacemaker transmission.   

## 2014-02-11 ENCOUNTER — Ambulatory Visit (INDEPENDENT_AMBULATORY_CARE_PROVIDER_SITE_OTHER): Payer: Medicare Other | Admitting: Pharmacist

## 2014-02-11 DIAGNOSIS — Z7901 Long term (current) use of anticoagulants: Secondary | ICD-10-CM

## 2014-02-11 DIAGNOSIS — I4891 Unspecified atrial fibrillation: Secondary | ICD-10-CM

## 2014-02-11 DIAGNOSIS — Z5181 Encounter for therapeutic drug level monitoring: Secondary | ICD-10-CM

## 2014-02-11 DIAGNOSIS — Z954 Presence of other heart-valve replacement: Secondary | ICD-10-CM

## 2014-02-11 LAB — POCT INR: INR: 3.4

## 2014-02-12 ENCOUNTER — Encounter: Payer: Self-pay | Admitting: Cardiology

## 2014-03-04 ENCOUNTER — Other Ambulatory Visit: Payer: Self-pay

## 2014-03-04 ENCOUNTER — Ambulatory Visit (INDEPENDENT_AMBULATORY_CARE_PROVIDER_SITE_OTHER): Payer: Medicare Other | Admitting: Pharmacist

## 2014-03-04 DIAGNOSIS — Z954 Presence of other heart-valve replacement: Secondary | ICD-10-CM

## 2014-03-04 DIAGNOSIS — Z7901 Long term (current) use of anticoagulants: Secondary | ICD-10-CM

## 2014-03-04 DIAGNOSIS — I4891 Unspecified atrial fibrillation: Secondary | ICD-10-CM

## 2014-03-04 DIAGNOSIS — Z5181 Encounter for therapeutic drug level monitoring: Secondary | ICD-10-CM

## 2014-03-04 LAB — POCT INR: INR: 2.5

## 2014-03-04 MED ORDER — FUROSEMIDE 40 MG PO TABS: ORAL_TABLET | ORAL | Status: DC

## 2014-03-09 ENCOUNTER — Other Ambulatory Visit: Payer: Self-pay

## 2014-03-09 MED ORDER — CARVEDILOL 25 MG PO TABS: ORAL_TABLET | ORAL | Status: DC

## 2014-03-16 ENCOUNTER — Ambulatory Visit (INDEPENDENT_AMBULATORY_CARE_PROVIDER_SITE_OTHER): Payer: Medicare Other | Admitting: Interventional Cardiology

## 2014-03-16 ENCOUNTER — Encounter: Payer: Self-pay | Admitting: Interventional Cardiology

## 2014-03-16 VITALS — BP 142/96 | HR 70 | Ht 73.0 in | Wt 312.0 lb

## 2014-03-16 DIAGNOSIS — Z954 Presence of other heart-valve replacement: Secondary | ICD-10-CM

## 2014-03-16 DIAGNOSIS — I10 Essential (primary) hypertension: Secondary | ICD-10-CM

## 2014-03-16 DIAGNOSIS — R0600 Dyspnea, unspecified: Secondary | ICD-10-CM

## 2014-03-16 DIAGNOSIS — I482 Chronic atrial fibrillation, unspecified: Secondary | ICD-10-CM

## 2014-03-16 DIAGNOSIS — R0609 Other forms of dyspnea: Secondary | ICD-10-CM

## 2014-03-16 DIAGNOSIS — I4891 Unspecified atrial fibrillation: Secondary | ICD-10-CM

## 2014-03-16 DIAGNOSIS — I5032 Chronic diastolic (congestive) heart failure: Secondary | ICD-10-CM

## 2014-03-16 DIAGNOSIS — I7100 Dissection of unspecified site of aorta: Secondary | ICD-10-CM | POA: Insufficient documentation

## 2014-03-16 DIAGNOSIS — Z7901 Long term (current) use of anticoagulants: Secondary | ICD-10-CM

## 2014-03-16 DIAGNOSIS — R0989 Other specified symptoms and signs involving the circulatory and respiratory systems: Secondary | ICD-10-CM

## 2014-03-16 DIAGNOSIS — I25810 Atherosclerosis of coronary artery bypass graft(s) without angina pectoris: Secondary | ICD-10-CM | POA: Insufficient documentation

## 2014-03-16 NOTE — Patient Instructions (Addendum)
Your physician recommends that you continue on your current medications as directed. Please refer to the Current Medication list given to you today.  Lab Today: Bmet, Bnp, Cbc  Your physician wants you to follow-up in: 1 year with Dr.Smith You will receive a reminder letter in the mail two months in advance. If you don't receive a letter, please call our office to schedule the follow-up appointment.

## 2014-03-16 NOTE — Progress Notes (Signed)
Patient ID: Brent Everett, male   DOB: Apr 16, 1941, 73 y.o.   MRN: [id number]    [address], [address] [place], [place]  [address] Phone: [phone] Fax:  [phone]  Date:  03/16/2014   ID:  Brent Everett, DOB [birth year], MRN [id number]  PCP:  PROVIDER NOT IN SYSTEM   ASSESSMENT:  1. 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 History of aortic dissection with aortic root resection, remotely 3. Coronary artery disease with bypass surgery and LIMA to LAD 1994 (previously 1990) 4. Diastolic heart failure, chronic. Rule out acute component causing complain of dyspnea on today's exam 5. Essential hypertension 6. chronic anticoagulation  PLAN:  1. Basic metabolic panel, BNP, CBC  2. Aerobic activity to facilitate weight loss 3. Clinical followup one year   SUBJECTIVE: Brent Everett is a 73 y.o. male who has had exertional dyspnea and also dyspnea with bending. There is some orthopnea. Mild lower extremity swelling is noted. No chest discomfort. He denies PND. No tachycardia or heart. No episodes of syncope. He denies blood in his urine and stool.   Wt Readings from Last 3 Encounters:  03/16/14 312 lb (141.522 kg)  07/30/13 308 lb 9.6 oz (139.98 kg)     Past Medical History  Diagnosis Date  . Hypertension   . Heart valve replaced by other means     Normal function  . Obesity, unspecified   . Depressive disorder, not elsewhere classified   . Allergic rhinitis   . Hyperlipidemia   . Esophageal reflux   . Unspecified sleep apnea   . Hypogonadism male   . ED (erectile dysfunction)   . DJD (degenerative joint disease) of cervical spine   . BPH (benign prostatic hyperplasia)   . Colon polyp   . Bradycardia     Severe  . 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 Caryl Comes  . Thoracic aneurysm   . Precordial pain     R/O aortic aneurysm enlargement  . Coronary atherosclerosis of native coronary artery     Stable w/out angina    . CAD (coronary artery disease)     Signs of mild volume overload  . OSA on CPAP   . Colon adenoma 2009  . Second degree AV block     With asymptomatic severe bradycardia  . Atrial fibrillation     Asymptomatic. Continuous A fib on pacer check 10/02/11.  . Atrial enlargement, left     Severe, by ECHO 12/31/11  . Right atrial enlargement     Moderate, by ECHO 12/31/11    Current Outpatient Prescriptions  Medication Sig Dispense Refill  . amLODipine (NORVASC) 10 MG tablet Take 10 mg by mouth daily.      Marland Kitchen atorvastatin (LIPITOR) 40 MG tablet Take 40 mg by mouth daily.      . carvedilol (COREG) 25 MG tablet TAKE 1 TABLET BY MOUTH TWICE A DAY  60 tablet  0  . doxazosin (CARDURA) 8 MG tablet Take 8 mg by mouth daily.      . DULoxetine (CYMBALTA) 60 MG capsule Take 60 mg by mouth daily.      Marland Kitchen eplerenone (INSPRA) 25 MG tablet take 1 tablet by mouth once daily  30 tablet  5  . furosemide (LASIX) 40 MG tablet take 1 tablet by mouth once daily  30 tablet  0  . omeprazole (PRILOSEC OTC) 20 MG tablet Take 20 mg by mouth as needed.      Marland Kitchen  potassium chloride SA (K-DUR,KLOR-CON) 20 MEQ tablet TAKE 1 TABLET BY MOUTH TWICE A DAY  60 tablet  5  . valsartan (DIOVAN) 320 MG tablet Take 320 mg by mouth daily.      Marland Kitchen warfarin (COUMADIN) 5 MG tablet Take as directed by Coumadin Clinic.  50 tablet  2   No current facility-administered medications for this visit.    Allergies:    Allergies  Allergen Reactions  . Codeine Itching  . Lotensin [Benazepril Hcl]   . Other     Vivax  . Sulfa Antibiotics     Social History:  The patient  reports that he has never smoked. He does not have any smokeless tobacco history on file. He reports that he drinks alcohol.   ROS:  Please see the history of present illness.   Marked obesity. As snoring. No chest pain. Dyspnea is he squats or bends over. Has 2-3 pillow orthopnea. Lower extremity swelling noted. No anginal quality chest pain.   All other systems reviewed and  negative.   OBJECTIVE: VS:  BP 142/96  Pulse 70  Ht 6\' 1"  (1.854 m)  Wt 312 lb (141.522 kg)  BMI 41.17 kg/m2 Well nourished, well developed, in no acute distress, marked obesity particularly abdominal HEENT: normal Neck: JVD flat. Carotid bruit absent  Cardiac:  normal S1, S2; RRR; no murmur Lungs:  clear to auscultation bilaterally, no wheezing, rhonchi or rales Abd: soft, nontender, no hepatomegaly Ext: Edema  1+ bilateral . Pulses 2+ and symmetric  Skin: warm and dry Neuro:  CNs 2-12 intact, no focal abnormalities noted  EKG:  V. pacing with underlying atrial fibrillation       Signed, Illene Labrador III, MD 03/16/2014 3:35 PM

## 2014-03-17 ENCOUNTER — Telehealth: Payer: Self-pay

## 2014-03-17 LAB — CBC WITH DIFFERENTIAL/PLATELET
BASOS ABS: 0 10*3/uL (ref 0.0–0.1)
Basophils Relative: 0.3 % (ref 0.0–3.0)
Eosinophils Absolute: 0.1 10*3/uL (ref 0.0–0.7)
Eosinophils Relative: 1.7 % (ref 0.0–5.0)
Hemoglobin: 18.7 g/dL (ref 13.0–17.0)
LYMPHS ABS: 0.9 10*3/uL (ref 0.7–4.0)
Lymphocytes Relative: 18 % (ref 12.0–46.0)
MCHC: 33.7 g/dL (ref 30.0–36.0)
MCV: 86.6 fl (ref 78.0–100.0)
MONOS PCT: 14.6 % — AB (ref 3.0–12.0)
Monocytes Absolute: 0.7 10*3/uL (ref 0.1–1.0)
NEUTROS ABS: 3.3 10*3/uL (ref 1.4–7.7)
Neutrophils Relative %: 65.4 % (ref 43.0–77.0)
Platelets: 114 10*3/uL — ABNORMAL LOW (ref 150.0–400.0)
RBC: 6.41 Mil/uL — ABNORMAL HIGH (ref 4.22–5.81)
RDW: 16.2 % — AB (ref 11.5–15.5)
WBC: 5 10*3/uL (ref 4.0–10.5)

## 2014-03-17 LAB — BASIC METABOLIC PANEL
BUN: 17 mg/dL (ref 6–23)
CHLORIDE: 111 meq/L (ref 96–112)
CO2: 28 mEq/L (ref 19–32)
Calcium: 10.1 mg/dL (ref 8.4–10.5)
Creatinine, Ser: 1.1 mg/dL (ref 0.4–1.5)
GFR: 89.39 mL/min (ref >60.00)
GLUCOSE: 91 mg/dL (ref 70–99)
POTASSIUM: 3.4 meq/L — AB (ref 3.5–5.1)
Sodium: 145 mEq/L (ref 135–145)

## 2014-03-17 LAB — BRAIN NATRIURETIC PEPTIDE: PRO B NATRI PEPTIDE: 290 pg/mL — AB (ref 0.0–100.0)

## 2014-03-17 NOTE — Telephone Encounter (Signed)
Santiago Glad from [facility] called to report critical Hemoglobin of 18.7 and Hematocrit of 55.5. the cbc machine has been down and labs are not yet viewable in pt charts. lab numbers reviewed by office dod Dr.Nahser that recommends that the pt hydrate and f/u with pcp.pt aware of Dr.Nahser recommendations. Update fwd to Dr.Smith. Adv pt once las are reviewed by Dr.Smith I will call back with his recommendations. Pt agreeable and verbalized understanding.

## 2014-03-17 NOTE — Telephone Encounter (Signed)
Copy lab results to Lavone Orn at [facility] [place].

## 2014-03-19 NOTE — Telephone Encounter (Signed)
Pt labs faxed to [facility] att: Dr.Griffin

## 2014-04-01 ENCOUNTER — Ambulatory Visit (INDEPENDENT_AMBULATORY_CARE_PROVIDER_SITE_OTHER): Payer: Medicare Other | Admitting: *Deleted

## 2014-04-01 ENCOUNTER — Other Ambulatory Visit: Payer: Self-pay

## 2014-04-01 DIAGNOSIS — Z5181 Encounter for therapeutic drug level monitoring: Secondary | ICD-10-CM

## 2014-04-01 DIAGNOSIS — Z7901 Long term (current) use of anticoagulants: Secondary | ICD-10-CM

## 2014-04-01 DIAGNOSIS — Z954 Presence of other heart-valve replacement: Secondary | ICD-10-CM

## 2014-04-01 DIAGNOSIS — I4891 Unspecified atrial fibrillation: Secondary | ICD-10-CM

## 2014-04-01 DIAGNOSIS — Z952 Presence of prosthetic heart valve: Secondary | ICD-10-CM

## 2014-04-01 LAB — POCT INR: INR: 2.8

## 2014-04-01 MED ORDER — DOXAZOSIN MESYLATE 8 MG PO TABS: 8.0000 mg | ORAL_TABLET | Freq: Every day | ORAL | Status: DC

## 2014-04-01 NOTE — Telephone Encounter (Signed)
Pt rqst refill for Cardura. Pt seen in cvrr today

## 2014-04-02 ENCOUNTER — Other Ambulatory Visit: Payer: Self-pay | Admitting: *Deleted

## 2014-04-02 MED ORDER — CARVEDILOL 25 MG PO TABS: ORAL_TABLET | ORAL | Status: DC

## 2014-04-12 ENCOUNTER — Other Ambulatory Visit: Payer: Self-pay

## 2014-04-12 MED ORDER — FUROSEMIDE 40 MG PO TABS: ORAL_TABLET | ORAL | Status: DC

## 2014-04-23 ENCOUNTER — Other Ambulatory Visit: Payer: Self-pay | Admitting: *Deleted

## 2014-04-23 MED ORDER — WARFARIN SODIUM 5 MG PO TABS: ORAL_TABLET | ORAL | Status: DC

## 2014-04-27 ENCOUNTER — Other Ambulatory Visit: Payer: Self-pay | Admitting: *Deleted

## 2014-04-27 MED ORDER — WARFARIN SODIUM 5 MG PO TABS: ORAL_TABLET | ORAL | Status: DC

## 2014-05-06 ENCOUNTER — Ambulatory Visit (INDEPENDENT_AMBULATORY_CARE_PROVIDER_SITE_OTHER): Payer: Medicare Other | Admitting: *Deleted

## 2014-05-06 DIAGNOSIS — I4891 Unspecified atrial fibrillation: Secondary | ICD-10-CM

## 2014-05-06 DIAGNOSIS — Z7901 Long term (current) use of anticoagulants: Secondary | ICD-10-CM

## 2014-05-06 DIAGNOSIS — Z5181 Encounter for therapeutic drug level monitoring: Secondary | ICD-10-CM

## 2014-05-06 DIAGNOSIS — Z954 Presence of other heart-valve replacement: Secondary | ICD-10-CM

## 2014-05-06 DIAGNOSIS — Z952 Presence of prosthetic heart valve: Secondary | ICD-10-CM

## 2014-05-06 LAB — POCT INR: INR: 2.9

## 2014-05-10 ENCOUNTER — Ambulatory Visit (INDEPENDENT_AMBULATORY_CARE_PROVIDER_SITE_OTHER): Payer: Medicare Other | Admitting: *Deleted

## 2014-05-10 DIAGNOSIS — I442 Atrioventricular block, complete: Secondary | ICD-10-CM

## 2014-05-10 NOTE — Progress Notes (Signed)
Remote pacemaker transmission.   

## 2014-05-11 ENCOUNTER — Encounter: Payer: Self-pay | Admitting: Internal Medicine

## 2014-05-11 LAB — MDC_IDC_ENUM_SESS_TYPE_REMOTE
Battery Remaining Longevity: 85 mo
Battery Remaining Percentage: 67 %
Brady Statistic AP VS Percent: 0 %
Brady Statistic AS VP Percent: 0 %
Brady Statistic AS VS Percent: 0 %
Brady Statistic RV Percent Paced: 97 %
Date Time Interrogation Session: 20151109070705
Implantable Pulse Generator Model: 2210
Implantable Pulse Generator Serial Number: 7255840
Lead Channel Impedance Value: 390 Ohm
Lead Channel Pacing Threshold Amplitude: 0.75 V
Lead Channel Pacing Threshold Pulse Width: 0.4 ms
Lead Channel Sensing Intrinsic Amplitude: 12 mV
Lead Channel Setting Pacing Amplitude: 2 V
Lead Channel Setting Pacing Pulse Width: 0.4 ms
Lead Channel Setting Sensing Sensitivity: 2 mV
MDC IDC MSMT BATTERY VOLTAGE: 2.93 V
MDC IDC MSMT LEADCHNL RV IMPEDANCE VALUE: 390 Ohm
MDC IDC SET LEADCHNL RV PACING AMPLITUDE: 1 V
MDC IDC STAT BRADY AP VP PERCENT: 0 %
MDC IDC STAT BRADY RA PERCENT PACED: 1 %

## 2014-05-19 ENCOUNTER — Encounter: Payer: Self-pay | Admitting: Cardiology

## 2014-05-28 ENCOUNTER — Other Ambulatory Visit: Payer: Self-pay

## 2014-05-28 MED ORDER — POTASSIUM CHLORIDE CRYS ER 20 MEQ PO TBCR: EXTENDED_RELEASE_TABLET | ORAL | Status: DC

## 2014-06-17 ENCOUNTER — Ambulatory Visit (INDEPENDENT_AMBULATORY_CARE_PROVIDER_SITE_OTHER): Payer: Medicare Other | Admitting: *Deleted

## 2014-06-17 DIAGNOSIS — I4891 Unspecified atrial fibrillation: Secondary | ICD-10-CM

## 2014-06-17 DIAGNOSIS — Z954 Presence of other heart-valve replacement: Secondary | ICD-10-CM

## 2014-06-17 DIAGNOSIS — Z952 Presence of prosthetic heart valve: Secondary | ICD-10-CM

## 2014-06-17 DIAGNOSIS — Z7901 Long term (current) use of anticoagulants: Secondary | ICD-10-CM

## 2014-06-17 DIAGNOSIS — Z5181 Encounter for therapeutic drug level monitoring: Secondary | ICD-10-CM

## 2014-06-17 LAB — POCT INR: INR: 2.8

## 2014-06-23 ENCOUNTER — Other Ambulatory Visit: Payer: Self-pay | Admitting: Interventional Cardiology

## 2014-07-30 ENCOUNTER — Encounter: Payer: Self-pay | Admitting: Internal Medicine

## 2014-07-30 ENCOUNTER — Ambulatory Visit (INDEPENDENT_AMBULATORY_CARE_PROVIDER_SITE_OTHER): Payer: Medicare Other | Admitting: *Deleted

## 2014-07-30 ENCOUNTER — Ambulatory Visit (INDEPENDENT_AMBULATORY_CARE_PROVIDER_SITE_OTHER): Payer: Medicare Other | Admitting: Internal Medicine

## 2014-07-30 VITALS — BP 146/78 | HR 60 | Ht 72.0 in | Wt 311.0 lb

## 2014-07-30 DIAGNOSIS — I10 Essential (primary) hypertension: Secondary | ICD-10-CM

## 2014-07-30 DIAGNOSIS — I482 Chronic atrial fibrillation, unspecified: Secondary | ICD-10-CM

## 2014-07-30 DIAGNOSIS — Z950 Presence of cardiac pacemaker: Secondary | ICD-10-CM

## 2014-07-30 DIAGNOSIS — Z954 Presence of other heart-valve replacement: Secondary | ICD-10-CM

## 2014-07-30 DIAGNOSIS — Z952 Presence of prosthetic heart valve: Secondary | ICD-10-CM

## 2014-07-30 DIAGNOSIS — Z5181 Encounter for therapeutic drug level monitoring: Secondary | ICD-10-CM

## 2014-07-30 LAB — MDC_IDC_ENUM_SESS_TYPE_INCLINIC
Battery Voltage: 2.93 V
Date Time Interrogation Session: 20160129120807
Implantable Pulse Generator Model: 2210
Implantable Pulse Generator Serial Number: 7255840
Lead Channel Impedance Value: 400 Ohm
Lead Channel Pacing Threshold Amplitude: 0.75 V
Lead Channel Pacing Threshold Pulse Width: 0.4 ms
Lead Channel Setting Pacing Amplitude: 1 V
Lead Channel Setting Pacing Pulse Width: 0.4 ms
Lead Channel Setting Sensing Sensitivity: 2 mV
MDC IDC MSMT BATTERY REMAINING LONGEVITY: 108 mo
MDC IDC MSMT LEADCHNL RA SENSING INTR AMPL: 0.9 mV
MDC IDC MSMT LEADCHNL RV IMPEDANCE VALUE: 437.5 Ohm
MDC IDC MSMT LEADCHNL RV SENSING INTR AMPL: 12 mV
MDC IDC STAT BRADY RA PERCENT PACED: 0.02 %
MDC IDC STAT BRADY RV PERCENT PACED: 97 %

## 2014-07-30 LAB — POCT INR: INR: 2.8

## 2014-07-30 NOTE — Assessment & Plan Note (Signed)
His St. Jude DDD PM is working normally. Will recheck in several months. 

## 2014-07-30 NOTE — Assessment & Plan Note (Signed)
His ventricular rate is well controlled. No change in medications.

## 2014-07-30 NOTE — Progress Notes (Signed)
HPI  Brent Everett returns today for ongoing evaluation and management of his PPM. The patient is a very pleasant 74 year old man with multiple medical problems. In 1990, he sustained an aortic dissection and underwent emergency surgery. In 1994, he underwent bypass surgery.  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 The patient is now developed atrial fibrillation with a very well controlled ventricular response. He is pacing the ventricle 99% of the time. He denies syncope. He is a very sedentary lifestyle, and watches TV most of the day. He has been unable to lose weight.  Allergies  Allergen Reactions  . Codeine Itching  . Lotensin [Benazepril Hcl]     UNKNOWN  . Other     Vivax Unknown reaction   . Sulfa Antibiotics     UNKNOWN     Current Outpatient Prescriptions  Medication Sig Dispense Refill  . amLODipine (NORVASC) 10 MG tablet Take 10 mg by mouth daily.    Marland Kitchen atorvastatin (LIPITOR) 40 MG tablet Take 40 mg by mouth daily.    . carvedilol (COREG) 25 MG tablet TAKE 1 TABLET BY MOUTH TWICE A DAY 60 tablet 10  . doxazosin (CARDURA) 8 MG tablet Take 1 tablet (8 mg total) by mouth daily. 30 tablet 11  . DULoxetine (CYMBALTA) 60 MG capsule Take 60 mg by mouth daily.    Marland Kitchen eplerenone (INSPRA) 25 MG tablet take 1 tablet by mouth once daily 30 tablet 9  . furosemide (LASIX) 40 MG tablet take 1 tablet by mouth once daily 30 tablet 12  . omeprazole (PRILOSEC OTC) 20 MG tablet Take 20 mg by mouth daily as needed (heartburn or acid reflux).     . potassium chloride SA (K-DUR,KLOR-CON) 20 MEQ tablet TAKE 1 TABLET BY MOUTH TWICE A DAY 60 tablet 5  . valsartan (DIOVAN) 320 MG tablet Take 320 mg by mouth daily.    Marland Kitchen warfarin (COUMADIN) 5 MG tablet Take as directed by Coumadin Clinic. 50 tablet 3   No current facility-administered medications for this visit.     Past Medical  History  Diagnosis Date  . Hypertension   . Heart valve replaced by other means     Normal function  . Obesity, unspecified   . Depressive disorder, not elsewhere classified   . Allergic rhinitis   . Hyperlipidemia   . Esophageal reflux   . Unspecified sleep apnea   . Hypogonadism male   . ED (erectile dysfunction)   . DJD (degenerative joint disease) of cervical spine   . BPH (benign prostatic hyperplasia)   . Colon polyp   . Bradycardia     Severe  . 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 Caryl Comes  . Thoracic aneurysm   . Precordial pain     R/O aortic aneurysm enlargement  . Coronary atherosclerosis of native coronary artery     Stable w/out angina  . CAD (coronary artery disease)     Signs of mild volume overload  . OSA on CPAP   . Colon adenoma 2009  . Second degree AV block     With asymptomatic severe bradycardia  . Atrial fibrillation     Asymptomatic. Continuous A fib on pacer check 10/02/11.  . Atrial enlargement, left     Severe, by ECHO 12/31/11  . Right atrial enlargement     Moderate, by ECHO 12/31/11  ROS:   All systems reviewed and negative except as noted in the HPI.   Past Surgical History  Procedure Laterality Date  . Pacemaker insertion  12/07/10    DDD pacemaker, Dr. Caryl Comes  . Other surgical history      Aortic dissection with repair, AV resuspension, and CABG to LAD and RCA in 1990. AVR with St. Jude and LIMA to LAD in 1994.  . Coronary artery bypass graft  1990    LAD to RCA   . Coronary artery bypass graft  1994    AVR with St. Jude and LIMA to LAD     Family History  Problem Relation Age of Onset  . Cancer Mother     pancreatic  . Cancer Maternal Grandmother   . Heart attack Maternal Grandfather   . Heart attack Paternal Grandfather   . Migraines Daughter      History   Social History  . Marital Status: Married    Spouse Name: N/A    Number of Children: N/A  . Years of Education: N/A   Occupational History  .  Not on file.   Social History Main Topics  . Smoking status: Never Smoker   . Smokeless tobacco: Not on file  . Alcohol Use: Yes     Comment: Occasional, one fifth of liquor last 1.5 months, beer infrequently  . Drug Use: Not on file  . Sexual Activity: Not on file   Other Topics Concern  . Not on file   Social History Narrative     BP 146/78 mmHg  Pulse 60  Ht 6' (1.829 m)  Wt 311 lb (141.069 kg)  BMI 42.17 kg/m2  Physical Exam:  obese appearing 74yo man, NAD HEENT: Unremarkable Neck:  No JVD, no thyromegally Back:  No CVA tenderness Lungs:  Clear with no wheezes, rales, or rhonchi. HEART:  Regular rate rhythm, 2/6 systolic murmurs, no rubs, no clicks, mechanical S2 Abd:  soft, positive bowel sounds, no organomegally, no rebound, no guarding Ext:  2 plus pulses, trace peripheral edema, no cyanosis, no clubbing Skin:  No rashes no nodules Neuro:  CN II through XII intact, motor grossly intact  DEVICE  Normal device function.  See PaceArt for details.   Assess/Plan:

## 2014-07-30 NOTE — Assessment & Plan Note (Signed)
His blood pressure is elevated. I have asked the patient to lose weight, exercise more and maintain a low sodium diet. He will continue his current medications.

## 2014-07-30 NOTE — Patient Instructions (Addendum)
Remote monitoring is used to monitor your Pacemaker of ICD from home. This monitoring reduces the number of office visits required to check your device to one time per year. It allows [place] to keep an eye on the functioning of your device to ensure it is working properly. You are scheduled for a device check from home on 11/01/14. You may send your transmission at any time that day. If you have a wireless device, the transmission will be sent automatically. After your physician reviews your transmission, you will receive a postcard with your next transmission date.  Your physician wants you to follow-up in: 1 year with Dr. Lovena Le. You will receive a reminder letter in the mail two months in advance. If you don't receive a letter, please call our office to schedule the follow-up appointment.  Your physician recommends that you continue on your current medications as directed. Please refer to the Current Medication list given to you today.

## 2014-08-25 ENCOUNTER — Other Ambulatory Visit: Payer: Self-pay | Admitting: Gastroenterology

## 2014-09-24 ENCOUNTER — Ambulatory Visit (INDEPENDENT_AMBULATORY_CARE_PROVIDER_SITE_OTHER): Payer: Medicare Other | Admitting: *Deleted

## 2014-09-24 DIAGNOSIS — I482 Chronic atrial fibrillation, unspecified: Secondary | ICD-10-CM

## 2014-09-24 DIAGNOSIS — Z954 Presence of other heart-valve replacement: Secondary | ICD-10-CM | POA: Diagnosis not present

## 2014-09-24 DIAGNOSIS — Z5181 Encounter for therapeutic drug level monitoring: Secondary | ICD-10-CM

## 2014-09-24 DIAGNOSIS — Z952 Presence of prosthetic heart valve: Secondary | ICD-10-CM

## 2014-09-24 LAB — POCT INR: INR: 2.1

## 2014-11-01 ENCOUNTER — Ambulatory Visit (INDEPENDENT_AMBULATORY_CARE_PROVIDER_SITE_OTHER): Payer: Medicare Other | Admitting: *Deleted

## 2014-11-01 DIAGNOSIS — I442 Atrioventricular block, complete: Secondary | ICD-10-CM

## 2014-11-01 NOTE — Progress Notes (Signed)
Remote pacemaker transmission.   

## 2014-11-02 ENCOUNTER — Encounter (HOSPITAL_COMMUNITY): Payer: Self-pay | Admitting: *Deleted

## 2014-11-05 ENCOUNTER — Ambulatory Visit (INDEPENDENT_AMBULATORY_CARE_PROVIDER_SITE_OTHER): Payer: Medicare Other | Admitting: *Deleted

## 2014-11-05 DIAGNOSIS — Z954 Presence of other heart-valve replacement: Secondary | ICD-10-CM

## 2014-11-05 DIAGNOSIS — Z5181 Encounter for therapeutic drug level monitoring: Secondary | ICD-10-CM

## 2014-11-05 DIAGNOSIS — I482 Chronic atrial fibrillation, unspecified: Secondary | ICD-10-CM

## 2014-11-05 DIAGNOSIS — Z952 Presence of prosthetic heart valve: Secondary | ICD-10-CM

## 2014-11-05 LAB — CUP PACEART REMOTE DEVICE CHECK
Battery Remaining Longevity: 106 mo
Battery Remaining Percentage: 74 %
Battery Voltage: 2.95 V
Date Time Interrogation Session: 20160502061125
Lead Channel Pacing Threshold Amplitude: 0.875 V
Lead Channel Sensing Intrinsic Amplitude: 12 mV
Lead Channel Setting Pacing Amplitude: 1.125
Lead Channel Setting Pacing Pulse Width: 0.4 ms
Lead Channel Setting Sensing Sensitivity: 2 mV
MDC IDC MSMT LEADCHNL RV IMPEDANCE VALUE: 400 Ohm
MDC IDC MSMT LEADCHNL RV PACING THRESHOLD PULSEWIDTH: 0.4 ms
MDC IDC PG SERIAL: 7255840
MDC IDC STAT BRADY RV PERCENT PACED: 98 %
Pulse Gen Model: 2210

## 2014-11-05 LAB — POCT INR: INR: 2.4

## 2014-11-09 ENCOUNTER — Ambulatory Visit (HOSPITAL_COMMUNITY): Admission: RE | Admit: 2014-11-09 | Payer: Medicare Other | Source: Ambulatory Visit | Admitting: Gastroenterology

## 2014-11-09 SURGERY — COLONOSCOPY WITH PROPOFOL
Anesthesia: Monitor Anesthesia Care

## 2014-11-11 ENCOUNTER — Encounter: Payer: Self-pay | Admitting: Cardiology

## 2014-11-16 ENCOUNTER — Encounter: Payer: Self-pay | Admitting: Internal Medicine

## 2014-11-25 ENCOUNTER — Other Ambulatory Visit: Payer: Self-pay | Admitting: Gastroenterology

## 2014-12-02 ENCOUNTER — Other Ambulatory Visit: Payer: Self-pay | Admitting: *Deleted

## 2014-12-02 MED ORDER — POTASSIUM CHLORIDE CRYS ER 20 MEQ PO TBCR: EXTENDED_RELEASE_TABLET | ORAL | Status: DC

## 2014-12-08 ENCOUNTER — Other Ambulatory Visit: Payer: Self-pay | Admitting: *Deleted

## 2014-12-08 MED ORDER — WARFARIN SODIUM 5 MG PO TABS: ORAL_TABLET | ORAL | Status: DC

## 2014-12-21 ENCOUNTER — Ambulatory Visit (INDEPENDENT_AMBULATORY_CARE_PROVIDER_SITE_OTHER): Payer: Medicare Other | Admitting: *Deleted

## 2014-12-21 DIAGNOSIS — I482 Chronic atrial fibrillation, unspecified: Secondary | ICD-10-CM

## 2014-12-21 DIAGNOSIS — Z5181 Encounter for therapeutic drug level monitoring: Secondary | ICD-10-CM

## 2014-12-21 DIAGNOSIS — Z952 Presence of prosthetic heart valve: Secondary | ICD-10-CM

## 2014-12-21 DIAGNOSIS — Z954 Presence of other heart-valve replacement: Secondary | ICD-10-CM | POA: Diagnosis not present

## 2014-12-21 LAB — POCT INR: INR: 2.8

## 2015-01-24 ENCOUNTER — Encounter: Payer: Self-pay | Admitting: Physician Assistant

## 2015-01-24 ENCOUNTER — Ambulatory Visit (INDEPENDENT_AMBULATORY_CARE_PROVIDER_SITE_OTHER): Payer: Medicare Other | Admitting: *Deleted

## 2015-01-24 ENCOUNTER — Ambulatory Visit (INDEPENDENT_AMBULATORY_CARE_PROVIDER_SITE_OTHER): Payer: Medicare Other | Admitting: Physician Assistant

## 2015-01-24 VITALS — BP 148/88 | HR 63 | Ht 73.0 in | Wt 305.0 lb

## 2015-01-24 DIAGNOSIS — Z01818 Encounter for other preprocedural examination: Secondary | ICD-10-CM | POA: Diagnosis not present

## 2015-01-24 DIAGNOSIS — I482 Chronic atrial fibrillation, unspecified: Secondary | ICD-10-CM

## 2015-01-24 DIAGNOSIS — I10 Essential (primary) hypertension: Secondary | ICD-10-CM | POA: Diagnosis not present

## 2015-01-24 DIAGNOSIS — Z5181 Encounter for therapeutic drug level monitoring: Secondary | ICD-10-CM

## 2015-01-24 DIAGNOSIS — Z952 Presence of prosthetic heart valve: Secondary | ICD-10-CM

## 2015-01-24 DIAGNOSIS — Z954 Presence of other heart-valve replacement: Secondary | ICD-10-CM

## 2015-01-24 LAB — POCT INR: INR: 2.7

## 2015-01-24 MED ORDER — ENOXAPARIN SODIUM 150 MG/ML ~~LOC~~ SOLN: 150.0000 mg | Freq: Two times a day (BID) | SUBCUTANEOUS | Status: DC

## 2015-01-24 NOTE — Patient Instructions (Signed)
02/09/15 Take your last dose of Coumadin   02/10/15 No Coumadin or Lovenox   02/11/15 Start taking Lovenox 150mg  at 9am and 9pm into the fatty abdominal tissue, 2 inches away from the navel, rotate the sites.   02/12/15 Continue taking Lovenox 150mg  at 9am and 9pm into the fatty abdominal tissue, 2 inches away from the navel, rotate the sites.   02/13/15 Continue taking Lovenox 150mg  at 9am and 9pm into the fatty abdominal tissue, 2 inches away from the navel, rotate the sites.   02/14/15 Continue taking Lovenox 150mg  at 9am into the fatty abdominal tissue, 2 inches away from the navel, rotate the sites.   02/15/15 Procedure date-NO LOVENOX or Coumadin  Resume your Coumadin when instructed to do so by the Physician.  Once resumed take an extra 1/2 tablet for doses. Continue Lovenox and Lovenox together until advised by CV[facility] on next appointment.

## 2015-01-24 NOTE — Assessment & Plan Note (Signed)
BP stable.

## 2015-01-24 NOTE — Patient Instructions (Signed)
Medication Instructions:  - No change  Labwork: - None  Testing/Procedures: -None  Follow-Up: Your physician wants you to follow-up in: 6 month with Dr. Tamala Julian. You will receive a reminder letter in the mail two months in advance. If you don't receive a letter, please call our office to schedule the follow-up appointment.   Any Other Special Instructions Will Be Listed Below (If Applicable).

## 2015-01-24 NOTE — Assessment & Plan Note (Signed)
Patient has crisp valve sounds. Follow-up with Dr. Tamala Julian in 6 months.

## 2015-01-24 NOTE — Progress Notes (Signed)
Cardiology Office Note   Date:  01/24/2015   ID:  Brent Everett, DOB [birth year], MRN [id number]  PCP:  No primary care provider on file.  Cardiologist: Dr. Daneen Schick EPS: Dr. Lovena Le  Chief Complaint: Preoperative clearance    History of Present Illness: Brent Everett is a 74 y.o. male who presents for preoperative clearance before undergoing colonoscopy. He has a history of aortic dissection and emergency surgery in 1990. He underwent a mechanical aVR and CABG in 1994, dual-chamber pacemaker for symptomatic bradycardia in 2012 and atrial fibrillation well-controlled with ventricular pacing at 99% of the time.  Patient is scheduled for colonoscopy 02/15/2015 by Dr. Wynetta Emery with [facility]. He has been asked to hold his Coumadin 5 days prior to the colonoscopy. He denies any chest pain. He started exercising 3 weeks ago at the gym. He rides a bike for 30 minutes and goes about 5 miles. He has occasional dizziness if he gets up too quickly but this is infrequent. He has been unable to lose weight. Last night he had some fluttering in his chest for the first time that last about 15 seconds. He has never noticed this before. It went away quickly and he had no other symptoms with it.    Past Medical History  Diagnosis Date  . Hypertension   . Heart valve replaced by other means     Normal function  . Obesity, unspecified   . Depressive disorder, not elsewhere classified   . Allergic rhinitis   . Hyperlipidemia   . Esophageal reflux   . Hypogonadism male   . ED (erectile dysfunction)   . DJD (degenerative joint disease) of cervical spine   . BPH (benign prostatic hyperplasia)   . Colon polyp   . Bradycardia     Severe  . 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 Caryl Comes  . Thoracic aneurysm   . Precordial pain     R/O aortic aneurysm enlargement  . Coronary atherosclerosis of native coronary artery     Stable w/out angina  . CAD (coronary artery disease)     Signs of  mild volume overload  . Colon adenoma 2009  . Second degree AV block     With asymptomatic severe bradycardia  . Atrial fibrillation     Asymptomatic. Continuous A fib on pacer check 10/02/11.  . Atrial enlargement, left     Severe, by ECHO 12/31/11  . Right atrial enlargement     Moderate, by ECHO 12/31/11  . Unspecified sleep apnea   . OSA on CPAP     cpap used    Past Surgical History  Procedure Laterality Date  . Pacemaker insertion  12/07/10    DDD pacemaker, Dr. Caryl Comes  . Other surgical history      Aortic dissection with repair, AV resuspension, and CABG to LAD and RCA in 1990. AVR with St. Jude and LIMA to LAD in 1994.  . Coronary artery bypass graft  1990    LAD to RCA x2 vessel  . Coronary artery bypass graft  1994    AVR with St. Jude and LIMA to LAD x1 vessel  . Knee arthroscopy Bilateral     knee scope  . Cataract extraction Bilateral     bilateral     Current Outpatient Prescriptions  Medication Sig Dispense Refill  . amLODipine (NORVASC) 10 MG tablet Take 10 mg by mouth daily.    Marland Kitchen atorvastatin (LIPITOR) 40 MG tablet Take 40 mg  by mouth daily.    . carvedilol (COREG) 25 MG tablet TAKE 1 TABLET BY MOUTH TWICE A DAY 60 tablet 10  . cholecalciferol (VITAMIN D) 1000 UNITS tablet Take 1,000 Units by mouth daily.    Marland Kitchen doxazosin (CARDURA) 8 MG tablet Take 1 tablet (8 mg total) by mouth daily. 30 tablet 11  . DULoxetine (CYMBALTA) 60 MG capsule Take 60 mg by mouth every morning.     Marland Kitchen eplerenone (INSPRA) 25 MG tablet take 1 tablet by mouth once daily 30 tablet 9  . furosemide (LASIX) 40 MG tablet take 1 tablet by mouth once daily 30 tablet 12  . potassium chloride SA (K-DUR,KLOR-CON) 20 MEQ tablet TAKE 1 TABLET BY MOUTH TWICE A DAY 60 tablet 3  . valsartan (DIOVAN) 320 MG tablet Take 320 mg by mouth every morning.     . warfarin (COUMADIN) 5 MG tablet Take as directed by Coumadin Clinic. 50 tablet 3   No current facility-administered medications for this visit.     Allergies:   Codeine; Lotensin; Other; and Sulfa antibiotics    Social History:  The patient  reports that he has never smoked. He does not have any smokeless tobacco history on file. He reports that he drinks alcohol. He reports that he does not use illicit drugs.   Family History:  The patient's    family history includes Cancer in his maternal grandmother and mother; Heart attack in his maternal grandfather and paternal grandfather; Migraines in his daughter.    ROS:  Please see the history of present illness.   Otherwise, review of systems are positive for none.   All other systems are reviewed and negative.    PHYSICAL EXAM: VS:  BP 148/88 mmHg  Pulse 63  Ht 6\' 1"  (1.854 m)  Wt 305 lb (138.347 kg)  BMI 40.25 kg/m2 , BMI Body mass index is 40.25 kg/(m^2). GEN: Well nourished, well developed, in no acute distress Neck: no JVD, HJR, carotid bruits, or masses Cardiac: Irregular irregular with crisp valve sounds; no murmurs,gallop, rubs, thrill or heave,  Respiratory:  clear to auscultation bilaterally, normal work of breathing GI: soft, nontender, nondistended, + BS MS: no deformity or atrophy Extremities: Trace of ankle edema without cyanosis, clubbing, good distal pulses bilaterally.  Skin: warm and dry, no rash Neuro:  Strength and sensation are intact    EKG:  EKG is ordered today. The ekg ordered today demonstrates Atrial fibrillation and PVC nonspecific intraventricular block   Recent Labs: 03/16/2014: BUN 17; Creatinine, Ser 1.1; Hemoglobin 18.7 Repeated and verified X2.*; Platelets 114.0*; Potassium 3.4*; Pro B Natriuretic peptide (BNP) 290.0*; Sodium 145    Lipid Panel No results found for: CHOL, TRIG, HDL, CHOLHDL, VLDL, LDLCALC, LDLDIRECT    Wt Readings from Last 3 Encounters:  01/24/15 305 lb (138.347 kg)  07/30/14 311 lb (141.069 kg)  03/16/14 312 lb (141.522 kg)      Other studies Reviewed: Additional studies/ records that were reviewed today  include and review of the records demonstrates:    ASSESSMENT AND PLAN:  Preoperative clearance Patient is here for preoperative clearance before undergoing colonoscopy on 03/20/15. Patient has history of CAD status post CABG and mechanical aortic valve replacement in 1994. He has not had a stress test since then but is asymptomatic and doing quite well. He exercises 30 minutes daily without difficulty. He also has atrial fibrillation that is controlled. He is on Coumadin for both valve replacement and atrial fibrillation. He will need the Coumadin/Lovenox  bridge which will be managed by our Coumadin clinic. I discussed this patient detail with Dr. Crissie Sickles who concurs. He does not need a further cardiac workup.  Atrial fibrillation Patient's rate is controlled with Coreg. He is on Coumadin. He did have palpitations last night for the first time. I told him he could take an extra half of the Coreg if this persists.  Hx of mechanical aortic valve replacement Patient has crisp valve sounds. Follow-up with Dr. Tamala Julian in 6 months.  Essential hypertension BP stable    Signed, Ermalinda Barrios, PA-C  01/24/2015 2:03 PM    [facility] [address], [place], [place]  [address] Phone: [phone]; Fax: [phone]

## 2015-01-24 NOTE — Assessment & Plan Note (Signed)
Patient is here for preoperative clearance before undergoing colonoscopy on 03/20/15. Patient has history of CAD status post CABG and mechanical aortic valve replacement in 1994. He has not had a stress test since then but is asymptomatic and doing quite well. He exercises 30 minutes daily without difficulty. He also has atrial fibrillation that is controlled. He is on Coumadin for both valve replacement and atrial fibrillation. He will need the Coumadin/Lovenox bridge which will be managed by our Coumadin clinic. I discussed this patient detail with Dr. Crissie Sickles who concurs. He does not need a further cardiac workup.

## 2015-01-24 NOTE — Assessment & Plan Note (Signed)
Patient's rate is controlled with Coreg. He is on Coumadin. He did have palpitations last night for the first time. I told him he could take an extra half of the Coreg if this persists.

## 2015-02-01 ENCOUNTER — Ambulatory Visit (INDEPENDENT_AMBULATORY_CARE_PROVIDER_SITE_OTHER): Payer: Medicare Other | Admitting: *Deleted

## 2015-02-01 DIAGNOSIS — I442 Atrioventricular block, complete: Secondary | ICD-10-CM | POA: Diagnosis not present

## 2015-02-02 NOTE — Progress Notes (Signed)
Remote pacemaker transmission.   

## 2015-02-07 LAB — CUP PACEART REMOTE DEVICE CHECK
Battery Remaining Percentage: 81 %
Brady Statistic RV Percent Paced: 98 %
Lead Channel Pacing Threshold Amplitude: 0.75 V
Lead Channel Sensing Intrinsic Amplitude: 12 mV
Lead Channel Setting Pacing Amplitude: 1 V
MDC IDC MSMT BATTERY REMAINING LONGEVITY: 116 mo
MDC IDC MSMT BATTERY VOLTAGE: 2.93 V
MDC IDC MSMT LEADCHNL RV IMPEDANCE VALUE: 410 Ohm
MDC IDC MSMT LEADCHNL RV PACING THRESHOLD PULSEWIDTH: 0.4 ms
MDC IDC PG SERIAL: 7255840
MDC IDC SESS DTM: 20160802060016
MDC IDC SET LEADCHNL RV PACING PULSEWIDTH: 0.4 ms
MDC IDC SET LEADCHNL RV SENSING SENSITIVITY: 2 mV

## 2015-02-10 ENCOUNTER — Encounter (HOSPITAL_COMMUNITY): Payer: Self-pay | Admitting: *Deleted

## 2015-02-15 ENCOUNTER — Ambulatory Visit (HOSPITAL_COMMUNITY): Payer: Medicare Other | Admitting: Anesthesiology

## 2015-02-15 ENCOUNTER — Encounter (HOSPITAL_COMMUNITY)
Admission: RE | Disposition: A | Discharge status: Home / Self Care | Payer: Self-pay | Source: Ambulatory Visit | Attending: Gastroenterology

## 2015-02-15 ENCOUNTER — Encounter (HOSPITAL_COMMUNITY): Payer: Self-pay

## 2015-02-15 ENCOUNTER — Ambulatory Visit (HOSPITAL_COMMUNITY)
Admission: RE | Admit: 2015-02-15 | Discharge: 2015-02-15 | Disposition: A | Discharge status: Home / Self Care | Payer: Medicare Other | Source: Ambulatory Visit | Attending: Gastroenterology | Admitting: Gastroenterology

## 2015-02-15 DIAGNOSIS — Z1211 Encounter for screening for malignant neoplasm of colon: Secondary | ICD-10-CM | POA: Insufficient documentation

## 2015-02-15 DIAGNOSIS — Z950 Presence of cardiac pacemaker: Secondary | ICD-10-CM | POA: Diagnosis not present

## 2015-02-15 DIAGNOSIS — I2510 Atherosclerotic heart disease of native coronary artery without angina pectoris: Secondary | ICD-10-CM | POA: Insufficient documentation

## 2015-02-15 DIAGNOSIS — I441 Atrioventricular block, second degree: Secondary | ICD-10-CM | POA: Insufficient documentation

## 2015-02-15 DIAGNOSIS — I4891 Unspecified atrial fibrillation: Secondary | ICD-10-CM | POA: Insufficient documentation

## 2015-02-15 DIAGNOSIS — G4730 Sleep apnea, unspecified: Secondary | ICD-10-CM | POA: Diagnosis not present

## 2015-02-15 DIAGNOSIS — Z86010 Personal history of colonic polyps: Secondary | ICD-10-CM | POA: Insufficient documentation

## 2015-02-15 DIAGNOSIS — Z7901 Long term (current) use of anticoagulants: Secondary | ICD-10-CM | POA: Insufficient documentation

## 2015-02-15 DIAGNOSIS — Z952 Presence of prosthetic heart valve: Secondary | ICD-10-CM | POA: Insufficient documentation

## 2015-02-15 DIAGNOSIS — Z888 Allergy status to other drugs, medicaments and biological substances status: Secondary | ICD-10-CM | POA: Insufficient documentation

## 2015-02-15 DIAGNOSIS — Z882 Allergy status to sulfonamides status: Secondary | ICD-10-CM | POA: Diagnosis not present

## 2015-02-15 DIAGNOSIS — Z885 Allergy status to narcotic agent status: Secondary | ICD-10-CM | POA: Diagnosis not present

## 2015-02-15 DIAGNOSIS — I10 Essential (primary) hypertension: Secondary | ICD-10-CM | POA: Insufficient documentation

## 2015-02-15 HISTORY — DX: Presence of cardiac pacemaker: Z95.0

## 2015-02-15 HISTORY — PX: COLONOSCOPY WITH PROPOFOL: SHX5780

## 2015-02-15 SURGERY — COLONOSCOPY WITH PROPOFOL
Anesthesia: Monitor Anesthesia Care

## 2015-02-15 MED ORDER — PROPOFOL 10 MG/ML IV BOLUS
INTRAVENOUS | Status: AC
  Filled 2015-02-15: qty 20

## 2015-02-15 MED ORDER — LACTATED RINGERS IV SOLN
INTRAVENOUS | Status: DC
  Administered 2015-02-15: 1000 mL via INTRAVENOUS
  Administered 2015-02-15: 11:00:00 via INTRAVENOUS

## 2015-02-15 MED ORDER — PROPOFOL INFUSION 10 MG/ML OPTIME
INTRAVENOUS | Status: DC | PRN
  Administered 2015-02-15: 140 ug/kg/min via INTRAVENOUS

## 2015-02-15 MED ORDER — SODIUM CHLORIDE 0.9 % IV SOLN: INTRAVENOUS | Status: DC

## 2015-02-15 MED ORDER — PROPOFOL 500 MG/50ML IV EMUL
INTRAVENOUS | Status: DC | PRN
  Administered 2015-02-15: 50 mg via INTRAVENOUS

## 2015-02-15 MED ORDER — LIDOCAINE HCL (CARDIAC) 20 MG/ML IV SOLN
INTRAVENOUS | Status: AC
  Filled 2015-02-15: qty 5

## 2015-02-15 SURGICAL SUPPLY — 22 items

## 2015-02-15 NOTE — Anesthesia Postprocedure Evaluation (Signed)
  Anesthesia Post-op Note  Patient: Brent Everett  Procedure(s) Performed: Procedure(s) (LRB): COLONOSCOPY WITH PROPOFOL (N/A)  Patient Location: PACU  Anesthesia Type: MAC  Level of Consciousness: awake and alert   Airway and Oxygen Therapy: Patient Spontanous Breathing  Post-op Pain: mild  Post-op Assessment: Post-op Vital signs reviewed, Patient's Cardiovascular Status Stable, Respiratory Function Stable, Patent Airway and No signs of Nausea or vomiting  Last Vitals:  Filed Vitals:   02/15/15 1216  BP: 158/85  Pulse: 67  Temp:   Resp: 20    Post-op Vital Signs: stable   Complications: No apparent anesthesia complications

## 2015-02-15 NOTE — Anesthesia Preprocedure Evaluation (Addendum)
Anesthesia Evaluation  Patient identified by MRN, date of birth, ID band Patient awake    Reviewed: Allergy & Precautions, NPO status , Patient's Chart, lab work & pertinent test results  Airway Mallampati: II  TM Distance: >3 FB Neck ROM: Full    Dental no notable dental hx.    Pulmonary sleep apnea and Continuous Positive Airway Pressure Ventilation ,  breath sounds clear to auscultation  Pulmonary exam normal       Cardiovascular hypertension, + CAD Normal cardiovascular examAtrial Fibrillation + pacemaker Rhythm:Regular Rate:Normal  S/p AVR 1994   Neuro/Psych negative neurological ROS  negative psych ROS   GI/Hepatic negative GI ROS, Neg liver ROS,   Endo/Other  negative endocrine ROS  Renal/GU negative Renal ROS  negative genitourinary   Musculoskeletal negative musculoskeletal ROS (+)   Abdominal   Peds negative pediatric ROS (+)  Hematology negative hematology ROS (+)   Anesthesia Other Findings   Reproductive/Obstetrics negative OB ROS                            Anesthesia Physical Anesthesia Plan  ASA: III  Anesthesia Plan: MAC   Post-op Pain Management:    Induction:   Airway Management Planned: Simple Face Mask  Additional Equipment:   Intra-op Plan:   Post-operative Plan:   Informed Consent: I have reviewed the patients History and Physical, chart, labs and discussed the procedure including the risks, benefits and alternatives for the proposed anesthesia with the patient or authorized representative who has indicated his/her understanding and acceptance.   Dental advisory given  Plan Discussed with: CRNA  Anesthesia Plan Comments:         Anesthesia Quick Evaluation

## 2015-02-15 NOTE — H&P (Signed)
  Procedure: Surveillance colonoscopy. 06/03/2008 colonoscopy performed with removal of a 7 mm descending colon tubular adenomatous polyp. Chronic Coumadin therapy post aortic valve replacement surgery and atrial fibrillation  History: The patient is a 74 year old male born 07-14-1940. He is scheduled to undergo a surveillance colonoscopy today. He stopped taking Coumadin 5 days ago and is receiving subcutaneous Lovenox as a bridge anticoagulative for his colonoscopy today. He last received subcutaneous Lovenox 24-hour's ago.  Past medical history: Hypertension. Hypercholesterolemia. Gastroesophageal reflux. Chronic atrial fibrillation. Allergic rhinitis. Coronary artery bypass grafting. Sitting June aortic valve replacement. Benign prostatic hypertrophy. Obstructive sleep apnea syndrome. Arthroscopic knee surgery. Bilateral cataract surgery. Pacemaker insertion for second-degree heart block.  Medication allergies: Vivax. Lotensin. Codeine. Sulfa drugs.  Exam: The patient is alert and lying comfortably on the endoscopy stretcher. Abdomen is soft and nontender to palpation. Lungs are clear to auscultation. Cardiac exam reveals a regular rhythm. Cardiac pacemaker placed for second-degree heart block and atrial fibrillation.  Plan: Proceed with surveillance colonoscopy

## 2015-02-15 NOTE — Op Note (Signed)
Procedure: Surveillance colonoscopy. 06/03/2008 colonoscopy performed with removal of a 7 mm descending colon tubular adenomatous polyp. Chronic Coumadin therapy post a aortic valve replacement surgery. Cardiac pacemaker placed for second-degree heart block and atrial fibrillation  Endoscopist: Earle Gell  Premedication: Propofol administered by anesthesia  Procedure: The patient was placed in the left lateral decubitus position. Anal inspection and digital rectal exam were normal. The Pentax pediatric colonoscope was introduced into the rectum and advanced to the cecum. A normal-appearing appendiceal orifice and ileocecal valve were identified. Colonic preparation for the exam today was good. Withdrawal time was 10 minutes  Rectum. Normal. Retroflexed view of the distal rectum was normal  Sigmoid colon and descending colon. Normal  Splenic flexure. Normal  Transverse colon. Normal  Hepatic flexure. Normal.  Ascending colon. Normal  Cecum and ileocecal valve. Normal  Assessment: Normal surveillance colonoscopy.

## 2015-02-15 NOTE — Transfer of Care (Signed)
Immediate Anesthesia Transfer of Care Note  Patient: Brent Everett  Procedure(s) Performed: Procedure(s): COLONOSCOPY WITH PROPOFOL (N/A)  Patient Location: PACU  Anesthesia Type:MAC  Level of Consciousness: sedated  Airway & Oxygen Therapy: Patient Spontanous Breathing and Patient connected to face mask oxygen  Post-op Assessment: Report given to RN and Post -op Vital signs reviewed and stable  Post vital signs: Reviewed and stable  Last Vitals:  Filed Vitals:   02/15/15 1033  BP: 154/74  Pulse: 60  Temp: 36.9 C  Resp: 20    Complications: No apparent anesthesia complications

## 2015-02-16 ENCOUNTER — Encounter (HOSPITAL_COMMUNITY): Payer: Self-pay | Admitting: Gastroenterology

## 2015-02-22 ENCOUNTER — Ambulatory Visit (INDEPENDENT_AMBULATORY_CARE_PROVIDER_SITE_OTHER): Payer: Medicare Other | Admitting: Pharmacist

## 2015-02-22 DIAGNOSIS — I482 Chronic atrial fibrillation, unspecified: Secondary | ICD-10-CM

## 2015-02-22 DIAGNOSIS — Z5181 Encounter for therapeutic drug level monitoring: Secondary | ICD-10-CM

## 2015-02-22 DIAGNOSIS — Z954 Presence of other heart-valve replacement: Secondary | ICD-10-CM

## 2015-02-22 DIAGNOSIS — Z952 Presence of prosthetic heart valve: Secondary | ICD-10-CM

## 2015-02-22 LAB — POCT INR: INR: 1.6

## 2015-03-02 ENCOUNTER — Encounter: Payer: Self-pay | Admitting: Cardiology

## 2015-03-02 ENCOUNTER — Other Ambulatory Visit: Payer: Self-pay | Admitting: *Deleted

## 2015-03-02 MED ORDER — CARVEDILOL 25 MG PO TABS: ORAL_TABLET | ORAL | Status: DC

## 2015-03-04 ENCOUNTER — Ambulatory Visit (INDEPENDENT_AMBULATORY_CARE_PROVIDER_SITE_OTHER): Payer: Medicare Other | Admitting: *Deleted

## 2015-03-04 DIAGNOSIS — I482 Chronic atrial fibrillation, unspecified: Secondary | ICD-10-CM

## 2015-03-04 DIAGNOSIS — Z954 Presence of other heart-valve replacement: Secondary | ICD-10-CM | POA: Diagnosis not present

## 2015-03-04 DIAGNOSIS — Z5181 Encounter for therapeutic drug level monitoring: Secondary | ICD-10-CM | POA: Diagnosis not present

## 2015-03-04 DIAGNOSIS — Z952 Presence of prosthetic heart valve: Secondary | ICD-10-CM

## 2015-03-04 LAB — POCT INR: INR: 2.2

## 2015-03-08 ENCOUNTER — Encounter: Payer: Self-pay | Admitting: Internal Medicine

## 2015-03-25 ENCOUNTER — Ambulatory Visit (INDEPENDENT_AMBULATORY_CARE_PROVIDER_SITE_OTHER): Payer: Medicare Other | Admitting: *Deleted

## 2015-03-25 DIAGNOSIS — Z952 Presence of prosthetic heart valve: Secondary | ICD-10-CM

## 2015-03-25 DIAGNOSIS — Z954 Presence of other heart-valve replacement: Secondary | ICD-10-CM | POA: Diagnosis not present

## 2015-03-25 DIAGNOSIS — I482 Chronic atrial fibrillation, unspecified: Secondary | ICD-10-CM

## 2015-03-25 DIAGNOSIS — Z5181 Encounter for therapeutic drug level monitoring: Secondary | ICD-10-CM

## 2015-03-25 LAB — POCT INR: INR: 2

## 2015-04-06 ENCOUNTER — Other Ambulatory Visit: Payer: Self-pay | Admitting: Interventional Cardiology

## 2015-04-06 MED ORDER — POTASSIUM CHLORIDE CRYS ER 20 MEQ PO TBCR: EXTENDED_RELEASE_TABLET | ORAL | Status: DC

## 2015-04-18 ENCOUNTER — Other Ambulatory Visit: Payer: Self-pay | Admitting: Interventional Cardiology

## 2015-04-18 MED ORDER — FUROSEMIDE 40 MG PO TABS: ORAL_TABLET | ORAL | Status: DC

## 2015-04-22 ENCOUNTER — Ambulatory Visit (INDEPENDENT_AMBULATORY_CARE_PROVIDER_SITE_OTHER): Payer: Medicare Other | Admitting: Pharmacist

## 2015-04-22 ENCOUNTER — Other Ambulatory Visit: Payer: Self-pay | Admitting: *Deleted

## 2015-04-22 DIAGNOSIS — I482 Chronic atrial fibrillation, unspecified: Secondary | ICD-10-CM

## 2015-04-22 DIAGNOSIS — Z954 Presence of other heart-valve replacement: Secondary | ICD-10-CM | POA: Diagnosis not present

## 2015-04-22 DIAGNOSIS — Z5181 Encounter for therapeutic drug level monitoring: Secondary | ICD-10-CM

## 2015-04-22 DIAGNOSIS — Z952 Presence of prosthetic heart valve: Secondary | ICD-10-CM

## 2015-04-22 LAB — POCT INR: INR: 2.6

## 2015-04-22 MED ORDER — DOXAZOSIN MESYLATE 8 MG PO TABS: 8.0000 mg | ORAL_TABLET | Freq: Every day | ORAL | Status: DC

## 2015-04-29 ENCOUNTER — Other Ambulatory Visit: Payer: Self-pay

## 2015-04-29 MED ORDER — EPLERENONE 25 MG PO TABS: 25.0000 mg | ORAL_TABLET | Freq: Every day | ORAL | Status: DC

## 2015-05-03 ENCOUNTER — Other Ambulatory Visit: Payer: Self-pay | Admitting: *Deleted

## 2015-05-03 MED ORDER — WARFARIN SODIUM 5 MG PO TABS: ORAL_TABLET | ORAL | Status: DC

## 2015-05-05 ENCOUNTER — Ambulatory Visit (INDEPENDENT_AMBULATORY_CARE_PROVIDER_SITE_OTHER): Payer: Medicare Other | Admitting: *Deleted

## 2015-05-05 DIAGNOSIS — I442 Atrioventricular block, complete: Secondary | ICD-10-CM | POA: Diagnosis not present

## 2015-05-06 NOTE — Progress Notes (Signed)
Remote pacemaker transmission.   

## 2015-05-12 ENCOUNTER — Telehealth: Payer: Self-pay | Admitting: Internal Medicine

## 2015-05-12 NOTE — Telephone Encounter (Signed)
New message  4. Are you calling to see if we received your device transmission? Yes

## 2015-05-12 NOTE — Telephone Encounter (Signed)
Attempted to return pt call. No answer and unable to leave a message.  

## 2015-05-13 NOTE — Telephone Encounter (Signed)
2nd attempt. No answer and unable to leave a message.

## 2015-05-16 NOTE — Telephone Encounter (Signed)
Informed pt that we did receive his transmission on 05-06-15.

## 2015-05-25 ENCOUNTER — Encounter: Payer: Self-pay | Admitting: Cardiology

## 2015-05-25 LAB — CUP PACEART REMOTE DEVICE CHECK
Battery Voltage: 2.95 V
Brady Statistic RV Percent Paced: 99 %
Implantable Lead Implant Date: 20120607
Implantable Lead Location: 753859
Lead Channel Pacing Threshold Amplitude: 0.75 V
Lead Channel Sensing Intrinsic Amplitude: 12 mV
Lead Channel Setting Pacing Amplitude: 1 V
MDC IDC LEAD IMPLANT DT: 20120607
MDC IDC LEAD LOCATION: 753860
MDC IDC MSMT BATTERY REMAINING LONGEVITY: 130 mo
MDC IDC MSMT BATTERY REMAINING PERCENTAGE: 91 %
MDC IDC MSMT LEADCHNL RV IMPEDANCE VALUE: 400 Ohm
MDC IDC MSMT LEADCHNL RV PACING THRESHOLD PULSEWIDTH: 0.4 ms
MDC IDC PG SERIAL: 7255840
MDC IDC SESS DTM: 20161103071402
MDC IDC SET LEADCHNL RV PACING PULSEWIDTH: 0.4 ms
MDC IDC SET LEADCHNL RV SENSING SENSITIVITY: 2 mV

## 2015-06-03 ENCOUNTER — Ambulatory Visit (INDEPENDENT_AMBULATORY_CARE_PROVIDER_SITE_OTHER): Payer: Medicare Other | Admitting: Pharmacist

## 2015-06-03 DIAGNOSIS — Z5181 Encounter for therapeutic drug level monitoring: Secondary | ICD-10-CM | POA: Diagnosis not present

## 2015-06-03 DIAGNOSIS — Z954 Presence of other heart-valve replacement: Secondary | ICD-10-CM | POA: Diagnosis not present

## 2015-06-03 DIAGNOSIS — I482 Chronic atrial fibrillation, unspecified: Secondary | ICD-10-CM

## 2015-06-03 DIAGNOSIS — Z952 Presence of prosthetic heart valve: Secondary | ICD-10-CM

## 2015-06-03 LAB — POCT INR: INR: 2.5

## 2015-06-30 ENCOUNTER — Other Ambulatory Visit: Payer: Self-pay

## 2015-06-30 MED ORDER — CARVEDILOL 25 MG PO TABS: ORAL_TABLET | ORAL | Status: DC

## 2015-06-30 NOTE — Telephone Encounter (Signed)
Imogene Burn, PA-C at 01/24/2015 2:03 PM  carvedilol (COREG) 25 MG tabletTAKE 1 TABLET BY MOUTH TWICE A DAY Atrial fibrillation Patient's rate is controlled with Coreg. He is on Coumadin. He did have palpitations last night for the first time. I told him he could take an extra half of the Coreg if this persists.

## 2015-07-15 ENCOUNTER — Ambulatory Visit (INDEPENDENT_AMBULATORY_CARE_PROVIDER_SITE_OTHER): Payer: Medicare Other | Admitting: *Deleted

## 2015-07-15 DIAGNOSIS — Z954 Presence of other heart-valve replacement: Secondary | ICD-10-CM

## 2015-07-15 DIAGNOSIS — I482 Chronic atrial fibrillation, unspecified: Secondary | ICD-10-CM

## 2015-07-15 DIAGNOSIS — Z5181 Encounter for therapeutic drug level monitoring: Secondary | ICD-10-CM

## 2015-07-15 DIAGNOSIS — Z952 Presence of prosthetic heart valve: Secondary | ICD-10-CM

## 2015-07-15 LAB — POCT INR: INR: 2.8

## 2015-08-02 ENCOUNTER — Ambulatory Visit (INDEPENDENT_AMBULATORY_CARE_PROVIDER_SITE_OTHER): Payer: Medicare Other | Admitting: Internal Medicine

## 2015-08-02 ENCOUNTER — Encounter: Payer: Self-pay | Admitting: Internal Medicine

## 2015-08-02 VITALS — BP 142/96 | HR 85 | Ht 73.0 in | Wt 303.0 lb

## 2015-08-02 DIAGNOSIS — R0602 Shortness of breath: Secondary | ICD-10-CM

## 2015-08-02 DIAGNOSIS — I482 Chronic atrial fibrillation, unspecified: Secondary | ICD-10-CM

## 2015-08-02 DIAGNOSIS — I442 Atrioventricular block, complete: Secondary | ICD-10-CM

## 2015-08-02 LAB — CUP PACEART INCLINIC DEVICE CHECK
Battery Remaining Longevity: 124.8
Battery Voltage: 2.95 V
Brady Statistic RA Percent Paced: 0 %
Date Time Interrogation Session: 20170131135002
Implantable Lead Implant Date: 20120607
Lead Channel Pacing Threshold Amplitude: 0.75 V
Lead Channel Pacing Threshold Pulse Width: 0.4 ms
Lead Channel Sensing Intrinsic Amplitude: 2.3 mV
Lead Channel Setting Pacing Amplitude: 1 V
Lead Channel Setting Pacing Pulse Width: 0.4 ms
Lead Channel Setting Sensing Sensitivity: 2 mV
MDC IDC LEAD IMPLANT DT: 20120607
MDC IDC LEAD LOCATION: 753859
MDC IDC LEAD LOCATION: 753860
MDC IDC MSMT LEADCHNL RV IMPEDANCE VALUE: 400 Ohm
MDC IDC MSMT LEADCHNL RV PACING THRESHOLD AMPLITUDE: 0.75 V
MDC IDC MSMT LEADCHNL RV PACING THRESHOLD PULSEWIDTH: 0.4 ms
MDC IDC MSMT LEADCHNL RV SENSING INTR AMPL: 12 mV
MDC IDC PG SERIAL: 7255840
MDC IDC STAT BRADY RV PERCENT PACED: 98 %
Pulse Gen Model: 2210

## 2015-08-02 NOTE — Patient Instructions (Signed)
Medication Instructions:  Your physician recommends that you continue on your current medications as directed. Please refer to the Current Medication list given to you today.   Labwork: None ordered   Testing/Procedures:  Your physician has requested that you have an echocardiogram. Echocardiography is a painless test that uses sound waves to create images of your heart. It provides your doctor with information about the size and shape of your heart and how well your heart's chambers and valves are working. This procedure takes approximately one hour. There are no restrictions for this procedure.     Follow-Up: Your physician wants you to follow-up in: 12 months with Dr Knox Saliva will receive a reminder letter in the mail two months in advance. If you don't receive a letter, please call our office to schedule the follow-up appointment.  Remote monitoring is used to monitor your Pacemaker  from home. This monitoring reduces the number of office visits required to check your device to one time per year. It allows [place] to keep an eye on the functioning of your device to ensure it is working properly. You are scheduled for a device check from home on 11/01/15. You may send your transmission at any time that day. If you have a wireless device, the transmission will be sent automatically. After your physician reviews your transmission, you will receive a postcard with your next transmission date.     Any Other Special Instructions Will Be Listed Below (If Applicable).     If you need a refill on your cardiac medications before your next appointment, please call your pharmacy.

## 2015-08-02 NOTE — Progress Notes (Signed)
HPI  Brent Everett returns today for ongoing evaluation and management of his PPM. The patient is a very pleasant 75 year old man with multiple medical problems. In 1990, he sustained an aortic dissection and underwent emergency surgery. In 1994, he underwent bypass surgery.  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 The patient is now developed atrial fibrillation with a very well controlled ventricular response. He is pacing the ventricle 99% of the time. He denies syncope. He is a very sedentary lifestyle, and watches TV most of the day. He has been unable to lose weight. He c/o dyspnea with exertion which is not new but is worsening. Allergies  Allergen Reactions  . Codeine Itching  . Lotensin [Benazepril Hcl]     UNKNOWN  . Other     Vivax Unknown reaction   . Sulfa Antibiotics     UNKNOWN     Current Outpatient Prescriptions  Medication Sig Dispense Refill  . amLODipine (NORVASC) 10 MG tablet Take 10 mg by mouth daily.    Marland Kitchen atorvastatin (LIPITOR) 40 MG tablet Take 40 mg by mouth daily.    . carvedilol (COREG) 25 MG tablet TAKE 1 TABLET BY MOUTH TWICE A DAY 60 tablet 2  . cholecalciferol (VITAMIN D) 1000 UNITS tablet Take 1,000 Units by mouth daily.    Marland Kitchen doxazosin (CARDURA) 8 MG tablet Take 1 tablet (8 mg total) by mouth daily. 30 tablet 5  . DULoxetine (CYMBALTA) 60 MG capsule Take 60 mg by mouth every morning.     Marland Kitchen eplerenone (INSPRA) 25 MG tablet Take 1 tablet (25 mg total) by mouth daily. 30 tablet 9  . furosemide (LASIX) 40 MG tablet take 1 tablet by mouth once daily 30 tablet 9  . potassium chloride SA (K-DUR,KLOR-CON) 20 MEQ tablet TAKE 1 TABLET BY MOUTH TWICE A DAY 60 tablet 3  . valsartan (DIOVAN) 320 MG tablet Take 320 mg by mouth every morning.     . warfarin (COUMADIN) 5 MG tablet Take as directed by Coumadin Clinic. 50 tablet 3   No current  facility-administered medications for this visit.     Past Medical History  Diagnosis Date  . Hypertension   . Heart valve replaced by other means     Normal function  . Obesity, unspecified   . Depressive disorder, not elsewhere classified   . Allergic rhinitis   . Hyperlipidemia   . Esophageal reflux   . Hypogonadism male   . ED (erectile dysfunction)   . BPH (benign prostatic hyperplasia)   . Colon polyp   . Bradycardia     Severe  . 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 Caryl Comes  . Thoracic aneurysm   . Precordial pain     R/O aortic aneurysm enlargement  . Coronary atherosclerosis of native coronary artery     Stable w/out angina  . CAD (coronary artery disease)     Signs of mild volume overload  . Colon adenoma 2009  . Atrial fibrillation (HCC)     Asymptomatic. Continuous A fib on pacer check 10/02/11.  . Atrial enlargement, left     Severe, by ECHO 12/31/11  . Right atrial enlargement     Moderate, by ECHO 12/31/11  . Unspecified sleep apnea   . OSA on CPAP     cpap used  . Second degree AV block     With  asymptomatic severe bradycardia  . DJD (degenerative joint disease) of cervical spine   . Presence of permanent cardiac pacemaker     ROS:   All systems reviewed and negative except as noted in the HPI.   Past Surgical History  Procedure Laterality Date  . Pacemaker insertion  12/07/10    DDD pacemaker, Dr. Caryl Comes  . Other surgical history      Aortic dissection with repair, AV resuspension, and CABG to LAD and RCA in 1990. AVR with St. Jude and LIMA to LAD in 1994.  . Coronary artery bypass graft  1990    LAD to RCA x2 vessel  . Coronary artery bypass graft  1994    AVR with St. Jude and LIMA to LAD x1 vessel  . Knee arthroscopy Bilateral     knee scope  . Cataract extraction Bilateral     bilateral  . Colonoscopy with propofol N/A 02/15/2015    Procedure: COLONOSCOPY WITH PROPOFOL;  Surgeon: Garlan Fair, MD;  Location: [facility];   Service: Endoscopy;  Laterality: N/A;     Family History  Problem Relation Age of Onset  . Cancer Mother     pancreatic  . Cancer Maternal Grandmother   . Heart attack Maternal Grandfather   . Heart attack Paternal Grandfather   . Migraines Daughter      Social History   Social History  . Marital Status: Married    Spouse Name: N/A  . Number of Children: N/A  . Years of Education: N/A   Occupational History  . Not on file.   Social History Main Topics  . Smoking status: Never Smoker   . Smokeless tobacco: Never Used  . Alcohol Use: Yes     Comment: Occasional, one fifth of liquor last 1.5 months, beer infrequently  . Drug Use: No  . Sexual Activity: Not on file   Other Topics Concern  . Not on file   Social History Narrative     BP 142/96 mmHg  Pulse 85  Ht 6\' 1"  (1.854 m)  Wt 303 lb (137.44 kg)  BMI 39.98 kg/m2  Physical Exam:  obese appearing 75yo man, NAD HEENT: Unremarkable Neck:  No JVD, no thyromegally Back:  No CVA tenderness Lungs:  Clear with no wheezes, rales, or rhonchi. HEART:  Regular rate rhythm, 2/6 systolic murmurs, no rubs, no clicks, mechanical S2 Abd:  soft, positive bowel sounds, no organomegally, no rebound, no guarding Ext:  2 plus pulses, trace peripheral edema, no cyanosis, no clubbing Skin:  No rashes no nodules Neuro:  CN II through XII intact, motor grossly intact  DEVICE  Normal device function.  See PaceArt for details.   Assess/Plan: 1. Worsening dyspnea - etiology is likely multifactorial. I have recommended he undergo 2D echo to see if his LV function has worsened since his PPM was placed. 2. CHB - he has no escape today. He is pacing in the ventricle 98% of the time. 3. Obesity - he is frustrated by his inability to lose weight.  4. S/p aortic valve replacement - he has a soft systolic murmur and a crisp mechanical S2.

## 2015-08-04 ENCOUNTER — Other Ambulatory Visit: Payer: Self-pay | Admitting: Interventional Cardiology

## 2015-08-15 ENCOUNTER — Ambulatory Visit (HOSPITAL_COMMUNITY): Payer: Medicare Other | Attending: Internal Medicine

## 2015-08-15 DIAGNOSIS — Z954 Presence of other heart-valve replacement: Secondary | ICD-10-CM | POA: Diagnosis not present

## 2015-08-15 DIAGNOSIS — G4733 Obstructive sleep apnea (adult) (pediatric): Secondary | ICD-10-CM | POA: Diagnosis not present

## 2015-08-15 DIAGNOSIS — I517 Cardiomegaly: Secondary | ICD-10-CM | POA: Insufficient documentation

## 2015-08-15 DIAGNOSIS — I10 Essential (primary) hypertension: Secondary | ICD-10-CM | POA: Diagnosis not present

## 2015-08-15 DIAGNOSIS — R0602 Shortness of breath: Secondary | ICD-10-CM | POA: Insufficient documentation

## 2015-08-15 DIAGNOSIS — Z6839 Body mass index (BMI) 39.0-39.9, adult: Secondary | ICD-10-CM | POA: Insufficient documentation

## 2015-08-15 DIAGNOSIS — R0600 Dyspnea, unspecified: Secondary | ICD-10-CM | POA: Diagnosis present

## 2015-08-15 DIAGNOSIS — E669 Obesity, unspecified: Secondary | ICD-10-CM | POA: Insufficient documentation

## 2015-08-15 DIAGNOSIS — E785 Hyperlipidemia, unspecified: Secondary | ICD-10-CM | POA: Insufficient documentation

## 2015-08-19 ENCOUNTER — Ambulatory Visit (INDEPENDENT_AMBULATORY_CARE_PROVIDER_SITE_OTHER): Payer: Medicare Other | Admitting: *Deleted

## 2015-08-19 ENCOUNTER — Ambulatory Visit (INDEPENDENT_AMBULATORY_CARE_PROVIDER_SITE_OTHER): Payer: Medicare Other | Admitting: Interventional Cardiology

## 2015-08-19 ENCOUNTER — Encounter: Payer: Self-pay | Admitting: Interventional Cardiology

## 2015-08-19 VITALS — BP 146/88 | HR 66 | Ht 73.0 in | Wt 302.8 lb

## 2015-08-19 DIAGNOSIS — Z5181 Encounter for therapeutic drug level monitoring: Secondary | ICD-10-CM | POA: Diagnosis not present

## 2015-08-19 DIAGNOSIS — Z954 Presence of other heart-valve replacement: Secondary | ICD-10-CM

## 2015-08-19 DIAGNOSIS — I71019 Dissection of thoracic aorta, unspecified: Secondary | ICD-10-CM

## 2015-08-19 DIAGNOSIS — I5032 Chronic diastolic (congestive) heart failure: Secondary | ICD-10-CM

## 2015-08-19 DIAGNOSIS — I442 Atrioventricular block, complete: Secondary | ICD-10-CM

## 2015-08-19 DIAGNOSIS — Z950 Presence of cardiac pacemaker: Secondary | ICD-10-CM

## 2015-08-19 DIAGNOSIS — Z952 Presence of prosthetic heart valve: Secondary | ICD-10-CM

## 2015-08-19 DIAGNOSIS — I482 Chronic atrial fibrillation, unspecified: Secondary | ICD-10-CM

## 2015-08-19 DIAGNOSIS — I10 Essential (primary) hypertension: Secondary | ICD-10-CM | POA: Diagnosis not present

## 2015-08-19 DIAGNOSIS — Z7901 Long term (current) use of anticoagulants: Secondary | ICD-10-CM

## 2015-08-19 DIAGNOSIS — I7101 Dissection of thoracic aorta: Secondary | ICD-10-CM

## 2015-08-19 LAB — POCT INR: INR: 3.1

## 2015-08-19 NOTE — Progress Notes (Signed)
Cardiology Office Note   Date:  08/19/2015   ID:  Brent Everett, DOB May 10, 1941, MRN [id number]  PCP:  Brent Shelling, MD  Cardiologist:  Brent Grooms, MD   Chief Complaint  Patient presents with  . Cardiac Valve Problem      History of Present Illness: Brent Everett is a 75 y.o. male who presents for aortic dissection, status post repair with aortic valve replacement using mechanical aortic prosthesis, chronic combined systolic and diastolic heart failure, atrial fibrillation, tachycardia bradycardia syndrome, and permanent pacemaker.  Brent Everett is doing well. He has dyspnea on exertion unchanged. He denies orthopnea and PND. He wears C Pap every night. He denies chest pain. He does have difficulty with orthostatic balance. If he walks off quickly after sitting for a while there is dizziness that leads to an balance. He also has some difficulty falling back to sleep if he wakes up at night.   Past Medical History  Diagnosis Date  . Hypertension   . Heart valve replaced by other means     Normal function  . Obesity, unspecified   . Depressive disorder, not elsewhere classified   . Allergic rhinitis   . Hyperlipidemia   . Esophageal reflux   . Hypogonadism male   . ED (erectile dysfunction)   . BPH (benign prostatic hyperplasia)   . Colon polyp   . Bradycardia     Severe  . 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 Caryl Everett  . Thoracic aneurysm   . Precordial pain     R/O aortic aneurysm enlargement  . Coronary atherosclerosis of native coronary artery     Stable w/out angina  . CAD (coronary artery disease)     Signs of mild volume overload  . Colon adenoma 2009  . Atrial fibrillation (HCC)     Asymptomatic. Continuous A fib on pacer check 10/02/11.  . Atrial enlargement, left     Severe, by ECHO 12/31/11  . Right atrial enlargement     Moderate, by ECHO 12/31/11  . Unspecified sleep apnea   . OSA on CPAP     cpap used  . Second degree AV block      With asymptomatic severe bradycardia  . DJD (degenerative joint disease) of cervical spine   . Presence of permanent cardiac pacemaker     Past Surgical History  Procedure Laterality Date  . Pacemaker insertion  12/07/10    DDD pacemaker, Dr. Caryl Everett  . Other surgical history      Aortic dissection with repair, AV resuspension, and CABG to LAD and RCA in 1990. AVR with St. Jude and LIMA to LAD in 1994.  . Coronary artery bypass graft  1990    LAD to RCA x2 vessel  . Coronary artery bypass graft  1994    AVR with St. Jude and LIMA to LAD x1 vessel  . Knee arthroscopy Bilateral     knee scope  . Cataract extraction Bilateral     bilateral  . Colonoscopy with propofol N/A 02/15/2015    Procedure: COLONOSCOPY WITH PROPOFOL;  Surgeon: Brent Fair, MD;  Location: [facility];  Service: Endoscopy;  Laterality: N/A;     Current Outpatient Prescriptions  Medication Sig Dispense Refill  . amLODipine (NORVASC) 10 MG tablet Take 10 mg by mouth daily.    Marland Kitchen atorvastatin (LIPITOR) 40 MG tablet Take 40 mg by mouth daily.    . carvedilol (COREG) 25 MG tablet TAKE 1  TABLET BY MOUTH TWICE A DAY 60 tablet 2  . cholecalciferol (VITAMIN D) 1000 UNITS tablet Take 1,000 Units by mouth daily.    Marland Kitchen doxazosin (CARDURA) 8 MG tablet Take 1 tablet (8 mg total) by mouth daily. 30 tablet 5  . DULoxetine (CYMBALTA) 60 MG capsule Take 60 mg by mouth every morning.     Marland Kitchen eplerenone (INSPRA) 25 MG tablet Take 1 tablet (25 mg total) by mouth daily. 30 tablet 9  . furosemide (LASIX) 40 MG tablet take 1 tablet by mouth once daily 30 tablet 9  . potassium chloride SA (K-DUR,KLOR-CON) 20 MEQ tablet take 1 tablet by mouth twice a day 60 tablet 0  . valsartan (DIOVAN) 320 MG tablet Take 320 mg by mouth every morning.     . warfarin (COUMADIN) 5 MG tablet Take as directed by Coumadin Clinic. 50 tablet 3   No current facility-administered medications for this visit.    Allergies:   Codeine; Lotensin; Other; and  Sulfa antibiotics    Social History:  The patient  reports that he has never smoked. He has never used smokeless tobacco. He reports that he drinks alcohol. He reports that he does not use illicit drugs.   Family History:  The patient's family history includes Cancer in his maternal grandmother and mother; Heart attack in his maternal grandfather and paternal grandfather; Migraines in his daughter.    ROS:  Please see the history of present illness.   Otherwise, review of systems are positive for depression, difficulty with balance, and dyspnea..   All other systems are reviewed and negative.    PHYSICAL EXAM: VS:  BP 146/88 mmHg  Pulse 66  Ht 6\' 1"  (1.854 m)  Wt 302 lb 12.8 oz (137.349 kg)  BMI 39.96 kg/m2 , BMI Body mass index is 39.96 kg/(m^2). GEN: Well nourished, well developed, in no acute distress HEENT: normal Neck: no JVD, carotid bruits, or masses Cardiac: RRR.  There is no murmur, rub, or gallop. Crisp aortic valve mechanical valve closure sounds. No diastolic murmur is heard. There is trace bilateral lower extremity edema. Respiratory:  clear to auscultation bilaterally, normal work of breathing. GI: soft, nontender, nondistended, + BS MS: no deformity or atrophy Skin: warm and dry, no rash Neuro:  Strength and sensation are intact Psych: euthymic mood, full affect   EKG:  EKG is not ordered today. The ekg from July 2016 demonstrates ventricular pacing with wide QRS   Recent Labs: No results found for requested labs within last 365 days.    Lipid Panel No results found for: CHOL, TRIG, HDL, CHOLHDL, VLDL, LDLCALC, LDLDIRECT    Wt Readings from Last 3 Encounters:  08/19/15 302 lb 12.8 oz (137.349 kg)  08/02/15 303 lb (137.44 kg)  02/15/15 305 lb (138.347 kg)      Other studies Reviewed: Additional studies/ records that were reviewed today include: Echocardiogram recently performed was reviewed.. The findings include LVEF 45-50% is similar to prior of  55-60%. No evidence of mechanical valve dysfunction..    ASSESSMENT AND PLAN:  1. Chronic atrial fibrillation (HCC) Stable without complications or evidence of rate control issues.  2. Chronic diastolic heart failure (HCC) No evidence of volume overload  3. Essential hypertension Borderline systolic control  4. Long term current use of anticoagulant therapy Coumadin therapy without bleeding complications  5. Dissection of thoracic aorta (HCC) No recurrent symptoms to suggest aortic dissection or progression  6. Hx of mechanical aortic valve replacement Mechanical aortic valve without evidence  of dysfunction  7. Atrioventricular block, complete (Real) Treated with DDD pacemaker  8. Pacemaker Normal function when last evaluated in July    Current medicines are reviewed at length with the patient today.  The patient has the following concerns regarding medicines: .  The following changes/actions have been instituted:    Recommended melatonin or Benadryl for sleep.  Recommended a 5-8 second pause is before walking after arising from a seated position to allow blood pressure homeostasis.  Aerobic activity is encouraged to facilitate weight loss and improve skeletal muscle conditioning  Labs/ tests ordered today include:  No orders of the defined types were placed in this encounter.     Disposition:   FU with HS in 1 year  Signed, Brent Grooms, MD  08/19/2015 1:43 PM    [facility] [address], [place], [place]  [address] Phone: [phone]; Fax: [phone]

## 2015-08-19 NOTE — Patient Instructions (Addendum)
Medication Instructions:  Your physician recommends that you continue on your current medications as directed. Please refer to the Current Medication list given to you today.  Labwork: NONE  Testing/Procedures: NONE  Follow-Up: Your physician wants you to follow-up in: 12 months with Dr. Tamala Julian.  You will receive a reminder letter in the mail two months in advance. If you don't receive a letter, please call our office to schedule the follow-up appointment.  If you need a refill on your cardiac medications before your next appointment, please call your pharmacy.  Please go to coumadin clinic right after appointment.  Your physician wants you to get more cardiovascular exercise in, such as, hiking, biking, and walking.

## 2015-09-02 ENCOUNTER — Other Ambulatory Visit: Payer: Self-pay | Admitting: *Deleted

## 2015-09-02 MED ORDER — POTASSIUM CHLORIDE CRYS ER 20 MEQ PO TBCR: 20.0000 meq | EXTENDED_RELEASE_TABLET | Freq: Two times a day (BID) | ORAL | Status: DC

## 2015-09-13 ENCOUNTER — Telehealth: Payer: Self-pay

## 2015-09-13 MED ORDER — WARFARIN SODIUM 5 MG PO TABS: ORAL_TABLET | ORAL | Status: DC

## 2015-09-13 NOTE — Telephone Encounter (Signed)
Pt called stating he needs a refill on Warfarin sent to [facility]. [place], [place]

## 2015-09-13 NOTE — Telephone Encounter (Signed)
Rx sent in for Coumadin refill.

## 2015-09-27 ENCOUNTER — Other Ambulatory Visit: Payer: Self-pay | Admitting: *Deleted

## 2015-09-27 MED ORDER — CARVEDILOL 25 MG PO TABS: ORAL_TABLET | ORAL | Status: DC

## 2015-09-30 ENCOUNTER — Ambulatory Visit (INDEPENDENT_AMBULATORY_CARE_PROVIDER_SITE_OTHER): Payer: Medicare Other | Admitting: *Deleted

## 2015-09-30 DIAGNOSIS — Z954 Presence of other heart-valve replacement: Secondary | ICD-10-CM

## 2015-09-30 DIAGNOSIS — Z5181 Encounter for therapeutic drug level monitoring: Secondary | ICD-10-CM | POA: Diagnosis not present

## 2015-09-30 DIAGNOSIS — I482 Chronic atrial fibrillation, unspecified: Secondary | ICD-10-CM

## 2015-09-30 DIAGNOSIS — Z952 Presence of prosthetic heart valve: Secondary | ICD-10-CM

## 2015-09-30 LAB — POCT INR: INR: 3.2

## 2015-10-14 ENCOUNTER — Other Ambulatory Visit: Payer: Self-pay

## 2015-10-14 MED ORDER — DOXAZOSIN MESYLATE 8 MG PO TABS: 8.0000 mg | ORAL_TABLET | Freq: Every day | ORAL | Status: DC

## 2015-10-18 ENCOUNTER — Telehealth: Payer: Self-pay | Admitting: Interventional Cardiology

## 2015-10-18 NOTE — Telephone Encounter (Signed)
Returned pt call. Pt sts that he needs to speak with Dr.Smith directly. Would not disclose why. Adv pt that I will fwd Dr.Smith a message to call him. Pt voiced appreciation.

## 2015-10-18 NOTE — Telephone Encounter (Signed)
Pt calling to request a call from Dr. Tamala Julian regarding patient son--pls call [phone]

## 2015-10-21 ENCOUNTER — Ambulatory Visit (INDEPENDENT_AMBULATORY_CARE_PROVIDER_SITE_OTHER): Payer: Medicare Other | Admitting: *Deleted

## 2015-10-21 DIAGNOSIS — Z954 Presence of other heart-valve replacement: Secondary | ICD-10-CM | POA: Diagnosis not present

## 2015-10-21 DIAGNOSIS — Z5181 Encounter for therapeutic drug level monitoring: Secondary | ICD-10-CM

## 2015-10-21 DIAGNOSIS — Z952 Presence of prosthetic heart valve: Secondary | ICD-10-CM

## 2015-10-21 DIAGNOSIS — I482 Chronic atrial fibrillation, unspecified: Secondary | ICD-10-CM

## 2015-10-21 LAB — POCT INR: INR: 3

## 2015-10-27 NOTE — Telephone Encounter (Signed)
Follow Up Call  Would like to speak to you directly .Marland Kitchen

## 2015-10-27 NOTE — Telephone Encounter (Signed)
Spoke with pt. He is faxing over info that he and Dr.Smith regarding something that he and Dr.Smith discussed. Provided pt the correct fax #, adv him to fax it to Dr.Smith's attn. Pt verbalized understanding.

## 2015-11-01 ENCOUNTER — Ambulatory Visit (INDEPENDENT_AMBULATORY_CARE_PROVIDER_SITE_OTHER): Payer: Medicare Other | Admitting: *Deleted

## 2015-11-01 DIAGNOSIS — I442 Atrioventricular block, complete: Secondary | ICD-10-CM | POA: Diagnosis not present

## 2015-11-01 NOTE — Progress Notes (Signed)
Remote pacemaker transmission.   

## 2015-11-08 ENCOUNTER — Telehealth: Payer: Self-pay | Admitting: Interventional Cardiology

## 2015-11-08 NOTE — Telephone Encounter (Signed)
New Message  Pt following up on information he faxed to Dr Tamala Julian last week. Please call back and discuss.

## 2015-11-08 NOTE — Telephone Encounter (Signed)
Adv pt that he fax has been received and it has been placed in Dr.Smith's "for review" folder. Pt ask that Dr.Smith give him a call after he has had the opportunity to review everything. Adv pt that I will fwd the the message to [place]. Pt voiced appreciation for the call

## 2015-11-16 NOTE — Telephone Encounter (Signed)
Called to give pt Dr.Smith's response from the fax received. [email or web address]

## 2015-11-18 ENCOUNTER — Ambulatory Visit (INDEPENDENT_AMBULATORY_CARE_PROVIDER_SITE_OTHER): Payer: Medicare Other | Admitting: *Deleted

## 2015-11-18 DIAGNOSIS — Z5181 Encounter for therapeutic drug level monitoring: Secondary | ICD-10-CM | POA: Diagnosis not present

## 2015-11-18 DIAGNOSIS — Z952 Presence of prosthetic heart valve: Secondary | ICD-10-CM

## 2015-11-18 DIAGNOSIS — Z954 Presence of other heart-valve replacement: Secondary | ICD-10-CM

## 2015-11-18 DIAGNOSIS — I482 Chronic atrial fibrillation, unspecified: Secondary | ICD-10-CM

## 2015-11-18 LAB — POCT INR: INR: 2.4

## 2015-12-09 ENCOUNTER — Encounter: Payer: Self-pay | Admitting: Cardiology

## 2015-12-13 LAB — CUP PACEART REMOTE DEVICE CHECK
Brady Statistic RV Percent Paced: 98 %
Date Time Interrogation Session: 20170502062331
Implantable Lead Implant Date: 20120607
Implantable Lead Location: 753859
Lead Channel Impedance Value: 400 Ohm
Lead Channel Sensing Intrinsic Amplitude: 12 mV
Lead Channel Setting Pacing Amplitude: 1 V
MDC IDC LEAD IMPLANT DT: 20120607
MDC IDC LEAD LOCATION: 753860
MDC IDC MSMT BATTERY REMAINING LONGEVITY: 116 mo
MDC IDC MSMT BATTERY REMAINING PERCENTAGE: 81 %
MDC IDC MSMT BATTERY VOLTAGE: 2.93 V
MDC IDC MSMT LEADCHNL RV PACING THRESHOLD AMPLITUDE: 0.75 V
MDC IDC MSMT LEADCHNL RV PACING THRESHOLD PULSEWIDTH: 0.4 ms
MDC IDC SET LEADCHNL RV PACING PULSEWIDTH: 0.4 ms
MDC IDC SET LEADCHNL RV SENSING SENSITIVITY: 2 mV
Pulse Gen Model: 2210
Pulse Gen Serial Number: 7255840

## 2015-12-16 ENCOUNTER — Ambulatory Visit (INDEPENDENT_AMBULATORY_CARE_PROVIDER_SITE_OTHER): Payer: Medicare Other | Admitting: *Deleted

## 2015-12-16 DIAGNOSIS — Z5181 Encounter for therapeutic drug level monitoring: Secondary | ICD-10-CM

## 2015-12-16 DIAGNOSIS — Z954 Presence of other heart-valve replacement: Secondary | ICD-10-CM

## 2015-12-16 DIAGNOSIS — I482 Chronic atrial fibrillation, unspecified: Secondary | ICD-10-CM

## 2015-12-16 DIAGNOSIS — Z952 Presence of prosthetic heart valve: Secondary | ICD-10-CM

## 2015-12-16 LAB — POCT INR: INR: 2.4

## 2016-01-25 ENCOUNTER — Other Ambulatory Visit: Payer: Self-pay

## 2016-01-26 MED ORDER — WARFARIN SODIUM 5 MG PO TABS: ORAL_TABLET | ORAL | 3 refills | Status: DC

## 2016-01-31 ENCOUNTER — Ambulatory Visit (INDEPENDENT_AMBULATORY_CARE_PROVIDER_SITE_OTHER): Payer: Medicare Other | Admitting: *Deleted

## 2016-01-31 DIAGNOSIS — Z950 Presence of cardiac pacemaker: Secondary | ICD-10-CM

## 2016-01-31 DIAGNOSIS — I442 Atrioventricular block, complete: Secondary | ICD-10-CM

## 2016-01-31 NOTE — Progress Notes (Signed)
Remote pacemaker transmission.   

## 2016-02-07 ENCOUNTER — Encounter: Payer: Self-pay | Admitting: Cardiology

## 2016-02-07 ENCOUNTER — Other Ambulatory Visit: Payer: Self-pay | Admitting: Interventional Cardiology

## 2016-02-15 ENCOUNTER — Telehealth: Payer: Self-pay | Admitting: Interventional Cardiology

## 2016-02-15 NOTE — Telephone Encounter (Signed)
Returned call to pt, scheduled follow-up appt for Friday 02/17/16 at 1:30pm.

## 2016-02-15 NOTE — Telephone Encounter (Signed)
New Message  Pt call requesting to speak with RN about scheduling a ETT. Please call back to discuss

## 2016-02-15 NOTE — Telephone Encounter (Signed)
Spoke with pt. He needs to schedule an appt with Coumadin for his PT not an ETT as was stated in the message. Pt would like to come in on 8/18 attempted to schedule for him, but there were no green slots available. Adv pt that I will fwd the message to the coumadin clinic to call him with an appt. Pt verbalized understanding.

## 2016-02-16 LAB — CUP PACEART REMOTE DEVICE CHECK
Battery Remaining Longevity: 117 mo
Battery Remaining Percentage: 81 %
Date Time Interrogation Session: 20170801065442
Implantable Lead Implant Date: 20120607
Lead Channel Pacing Threshold Amplitude: 0.75 V
Lead Channel Pacing Threshold Pulse Width: 0.4 ms
Lead Channel Sensing Intrinsic Amplitude: 12 mV
Lead Channel Setting Sensing Sensitivity: 2 mV
MDC IDC LEAD IMPLANT DT: 20120607
MDC IDC LEAD LOCATION: 753859
MDC IDC LEAD LOCATION: 753860
MDC IDC MSMT BATTERY VOLTAGE: 2.93 V
MDC IDC MSMT LEADCHNL RV IMPEDANCE VALUE: 440 Ohm
MDC IDC PG SERIAL: 7255840
MDC IDC SET LEADCHNL RV PACING AMPLITUDE: 1 V
MDC IDC SET LEADCHNL RV PACING PULSEWIDTH: 0.4 ms
MDC IDC STAT BRADY RV PERCENT PACED: 98 %
Pulse Gen Model: 2210

## 2016-02-17 ENCOUNTER — Encounter (INDEPENDENT_AMBULATORY_CARE_PROVIDER_SITE_OTHER): Payer: Self-pay

## 2016-02-17 ENCOUNTER — Ambulatory Visit (INDEPENDENT_AMBULATORY_CARE_PROVIDER_SITE_OTHER): Payer: Medicare Other | Admitting: Pharmacist

## 2016-02-17 DIAGNOSIS — I482 Chronic atrial fibrillation, unspecified: Secondary | ICD-10-CM

## 2016-02-17 DIAGNOSIS — Z952 Presence of prosthetic heart valve: Secondary | ICD-10-CM

## 2016-02-17 DIAGNOSIS — Z954 Presence of other heart-valve replacement: Secondary | ICD-10-CM

## 2016-02-17 DIAGNOSIS — Z5181 Encounter for therapeutic drug level monitoring: Secondary | ICD-10-CM

## 2016-02-17 LAB — POCT INR: INR: 2

## 2016-02-26 ENCOUNTER — Other Ambulatory Visit: Payer: Self-pay | Admitting: Interventional Cardiology

## 2016-03-30 ENCOUNTER — Ambulatory Visit (INDEPENDENT_AMBULATORY_CARE_PROVIDER_SITE_OTHER): Payer: Medicare Other | Admitting: *Deleted

## 2016-03-30 DIAGNOSIS — I482 Chronic atrial fibrillation, unspecified: Secondary | ICD-10-CM

## 2016-03-30 DIAGNOSIS — Z5181 Encounter for therapeutic drug level monitoring: Secondary | ICD-10-CM

## 2016-03-30 DIAGNOSIS — Z952 Presence of prosthetic heart valve: Secondary | ICD-10-CM | POA: Diagnosis not present

## 2016-03-30 LAB — POCT INR: INR: 2.9

## 2016-05-01 ENCOUNTER — Ambulatory Visit (INDEPENDENT_AMBULATORY_CARE_PROVIDER_SITE_OTHER): Payer: Medicare Other | Admitting: *Deleted

## 2016-05-01 DIAGNOSIS — I442 Atrioventricular block, complete: Secondary | ICD-10-CM

## 2016-05-02 NOTE — Progress Notes (Signed)
Remote pacemaker transmission.   

## 2016-05-09 ENCOUNTER — Encounter: Payer: Self-pay | Admitting: Cardiology

## 2016-05-09 ENCOUNTER — Ambulatory Visit (INDEPENDENT_AMBULATORY_CARE_PROVIDER_SITE_OTHER): Payer: Medicare Other | Admitting: *Deleted

## 2016-05-09 DIAGNOSIS — Z5181 Encounter for therapeutic drug level monitoring: Secondary | ICD-10-CM | POA: Diagnosis not present

## 2016-05-09 DIAGNOSIS — I482 Chronic atrial fibrillation, unspecified: Secondary | ICD-10-CM

## 2016-05-09 DIAGNOSIS — Z952 Presence of prosthetic heart valve: Secondary | ICD-10-CM | POA: Diagnosis not present

## 2016-05-09 LAB — POCT INR: INR: 2.8

## 2016-05-10 ENCOUNTER — Telehealth: Payer: Self-pay | Admitting: Interventional Cardiology

## 2016-05-10 DIAGNOSIS — R0602 Shortness of breath: Secondary | ICD-10-CM

## 2016-05-10 NOTE — Telephone Encounter (Signed)
Call transferred into triage for SOB.  Pt reports that he has SOB with exertion that has been going on for a couple months.  His BP runs140s/80s, HR 70-s80s.  Recently moving the trash can from the back of the house to the front of the house while talking on the phone he was gasping for air and this concerned him.  This completely resolves with rest and is accompanied by NO other symptoms.     At last OV with Dr. Tamala Julian he notes Brent Everett has unchanged dyspnea on exertion.    Pt states it is probably from not doing much physical activity or exercising regularly.  He states his wife was recently diagnosed with multiple myeloma and so has been busy with that.  I offered him an APP appointment, or that I would send to Dr. Tamala Julian a message to see what he would recommend.   Brent Everett is agreeable to wait for Dr. Thompson Caul recommendations at this point.

## 2016-05-10 NOTE — Telephone Encounter (Signed)
New Message:    Pt is SOB as I talk to him,no other symptoms or complaints.

## 2016-05-11 NOTE — Telephone Encounter (Signed)
Informed pt of Dr.Smith's orders and recommendations.  Lab scheduled for 11/15.  Pt verbalized understanding and instructions and was in agreement with plan.

## 2016-05-11 NOTE — Telephone Encounter (Signed)
Increase furosemide to 80 mg per day for 5 days. Decreased back to 40 mg daily thereafter. Please have a basic metabolic panel performed within the 40 mg of Lasix is resumed (5 days from now).  He should call and report whether the increase diuretic has helped improve his breathing.  If he feels worse after Lasix dose is increased, cut back to 40 mg and call us.

## 2016-05-16 ENCOUNTER — Other Ambulatory Visit: Payer: Medicare Other | Admitting: *Deleted

## 2016-05-16 DIAGNOSIS — R0602 Shortness of breath: Secondary | ICD-10-CM

## 2016-05-17 LAB — BASIC METABOLIC PANEL
BUN: 12 mg/dL (ref 7–25)
CO2: 28 mmol/L (ref 20–31)
Calcium: 10 mg/dL (ref 8.6–10.3)
Chloride: 108 mmol/L (ref 98–110)
Creat: 1.16 mg/dL (ref 0.70–1.18)
GLUCOSE: 99 mg/dL (ref 65–99)
POTASSIUM: 3.7 mmol/L (ref 3.5–5.3)
Sodium: 143 mmol/L (ref 135–146)

## 2016-05-31 LAB — CUP PACEART REMOTE DEVICE CHECK
Battery Voltage: 2.93 V
Brady Statistic RV Percent Paced: 97 %
Implantable Lead Location: 753860
Lead Channel Impedance Value: 390 Ohm
Lead Channel Pacing Threshold Amplitude: 0.75 V
Lead Channel Sensing Intrinsic Amplitude: 12 mV
Lead Channel Setting Pacing Pulse Width: 0.4 ms
MDC IDC LEAD IMPLANT DT: 20120607
MDC IDC LEAD IMPLANT DT: 20120607
MDC IDC LEAD LOCATION: 753859
MDC IDC MSMT BATTERY REMAINING LONGEVITY: 116 mo
MDC IDC MSMT BATTERY REMAINING PERCENTAGE: 81 %
MDC IDC MSMT LEADCHNL RV PACING THRESHOLD PULSEWIDTH: 0.4 ms
MDC IDC PG IMPLANT DT: 20120607
MDC IDC SESS DTM: 20171031072411
MDC IDC SET LEADCHNL RV PACING AMPLITUDE: 1 V
MDC IDC SET LEADCHNL RV SENSING SENSITIVITY: 2 mV
Pulse Gen Serial Number: 7255840

## 2016-06-13 ENCOUNTER — Other Ambulatory Visit: Payer: Self-pay | Admitting: Interventional Cardiology

## 2016-06-20 ENCOUNTER — Ambulatory Visit (INDEPENDENT_AMBULATORY_CARE_PROVIDER_SITE_OTHER): Payer: Medicare Other | Admitting: Pharmacist

## 2016-06-20 DIAGNOSIS — Z952 Presence of prosthetic heart valve: Secondary | ICD-10-CM

## 2016-06-20 DIAGNOSIS — I482 Chronic atrial fibrillation, unspecified: Secondary | ICD-10-CM

## 2016-06-20 DIAGNOSIS — I4891 Unspecified atrial fibrillation: Secondary | ICD-10-CM

## 2016-06-20 DIAGNOSIS — Z5181 Encounter for therapeutic drug level monitoring: Secondary | ICD-10-CM

## 2016-06-20 LAB — POCT INR: INR: 2.4

## 2016-07-19 ENCOUNTER — Encounter: Payer: Medicare Other | Admitting: Internal Medicine

## 2016-07-31 ENCOUNTER — Other Ambulatory Visit: Payer: Self-pay | Admitting: Interventional Cardiology

## 2016-08-03 ENCOUNTER — Ambulatory Visit (INDEPENDENT_AMBULATORY_CARE_PROVIDER_SITE_OTHER): Payer: Medicare Other | Admitting: *Deleted

## 2016-08-03 ENCOUNTER — Encounter: Payer: Self-pay | Admitting: Internal Medicine

## 2016-08-03 ENCOUNTER — Ambulatory Visit (INDEPENDENT_AMBULATORY_CARE_PROVIDER_SITE_OTHER): Payer: Medicare Other | Admitting: Internal Medicine

## 2016-08-03 VITALS — BP 157/91 | HR 63 | Ht 73.0 in | Wt 291.2 lb

## 2016-08-03 DIAGNOSIS — Z5181 Encounter for therapeutic drug level monitoring: Secondary | ICD-10-CM

## 2016-08-03 DIAGNOSIS — I482 Chronic atrial fibrillation, unspecified: Secondary | ICD-10-CM

## 2016-08-03 DIAGNOSIS — Z952 Presence of prosthetic heart valve: Secondary | ICD-10-CM | POA: Diagnosis not present

## 2016-08-03 DIAGNOSIS — I442 Atrioventricular block, complete: Secondary | ICD-10-CM | POA: Diagnosis not present

## 2016-08-03 DIAGNOSIS — Z950 Presence of cardiac pacemaker: Secondary | ICD-10-CM

## 2016-08-03 DIAGNOSIS — I4891 Unspecified atrial fibrillation: Secondary | ICD-10-CM

## 2016-08-03 LAB — CUP PACEART INCLINIC DEVICE CHECK
Brady Statistic RA Percent Paced: 0 %
Brady Statistic RV Percent Paced: 96 %
Date Time Interrogation Session: 20180202155411
Implantable Lead Implant Date: 20120607
Implantable Lead Location: 753860
Lead Channel Impedance Value: 400 Ohm
Lead Channel Sensing Intrinsic Amplitude: 12 mV
Lead Channel Setting Pacing Amplitude: 1 V
Lead Channel Setting Pacing Pulse Width: 0.4 ms
Lead Channel Setting Sensing Sensitivity: 2 mV
MDC IDC LEAD IMPLANT DT: 20120607
MDC IDC LEAD LOCATION: 753859
MDC IDC MSMT BATTERY VOLTAGE: 2.93 V
MDC IDC MSMT LEADCHNL RV PACING THRESHOLD AMPLITUDE: 0.75 V
MDC IDC MSMT LEADCHNL RV PACING THRESHOLD PULSEWIDTH: 0.4 ms
MDC IDC PG IMPLANT DT: 20120607
Pulse Gen Model: 2210
Pulse Gen Serial Number: 7255840

## 2016-08-03 LAB — POCT INR: INR: 3

## 2016-08-03 NOTE — Progress Notes (Signed)
HPI  [name] returns today for ongoing evaluation and management of his PPM. The patient is a very pleasant 76 year old man with multiple medical problems. In 1990, he sustained an aortic dissection and underwent emergency surgery. In 1994, he underwent bypass surgery.  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 The patient now has atrial fibrillation with a very well controlled ventricular response. He is pacing the ventricle 99% of the time. He denies syncope. He is a very sedentary lifestyle, and watches TV most of the day. He has lost about 10 lbs. He c/o dyspnea with exertion which is not new but is worsening.he is quite sedentary.  Allergies  Allergen Reactions  . Codeine Itching  . Lotensin [Benazepril Hcl]     UNKNOWN  . Other     Vivax Unknown reaction   . Sulfa Antibiotics     UNKNOWN     Current Outpatient Prescriptions  Medication Sig Dispense Refill  . amLODipine (NORVASC) 10 MG tablet Take 10 mg by mouth daily.    Marland Kitchen atorvastatin (LIPITOR) 40 MG tablet Take 40 mg by mouth daily.    . carvedilol (COREG) 25 MG tablet TAKE 1 TABLET BY MOUTH TWICE A DAY 60 tablet 11  . cholecalciferol (VITAMIN D) 1000 UNITS tablet Take 1,000 Units by mouth daily.    Marland Kitchen doxazosin (CARDURA) 8 MG tablet Take 1 tablet (8 mg total) by mouth daily. 30 tablet 10  . DULoxetine (CYMBALTA) 60 MG capsule Take 60 mg by mouth every morning.     Marland Kitchen eplerenone (INSPRA) 25 MG tablet take 1 tablet by mouth once daily 30 tablet 5  . furosemide (LASIX) 40 MG tablet take 1 tablet by mouth once daily 30 tablet 5  . Melatonin 3 MG TABS Take 1 tablet by mouth at bedtime.    . potassium chloride SA (K-DUR,KLOR-CON) 20 MEQ tablet take 1 tablet by mouth twice a day 60 tablet 0  . valsartan (DIOVAN) 320 MG tablet Take 320 mg by mouth every morning.     . warfarin (COUMADIN) 5 MG tablet TAKE AS DIRECTED  BY COUMADIN CLINIC 50 tablet 3   No current facility-administered medications for this visit.      Past Medical History:  Diagnosis Date  . Allergic rhinitis   . Atrial enlargement, left    Severe, by ECHO 12/31/11  . Atrial fibrillation (HCC)    Asymptomatic. Continuous A fib on pacer check 10/02/11.  Marland Kitchen BPH (benign prostatic hyperplasia)   . Bradycardia    Severe  . CAD (coronary artery disease)    Signs of mild volume overload  . Cardiac pacemaker in situ 12/07/10   DDD pacemaker, Dr. Caryl Comes  . Colon adenoma 2009  . Colon polyp   . Coronary atherosclerosis of native coronary artery    Stable w/out angina  . Depressive disorder, not elsewhere classified   . DJD (degenerative joint disease) of cervical spine   . ED (erectile dysfunction)   . Esophageal reflux   . Heart valve replaced by other means    Normal function  . Hyperlipidemia   . Hypertension   . Hypogonadism male   . Obesity, unspecified   . OSA on CPAP    cpap used  . Precordial pain    R/O aortic aneurysm enlargement  . Presence of permanent cardiac pacemaker   . Right atrial enlargement    Moderate, by  ECHO 12/31/11  . Second degree AV block    With asymptomatic severe bradycardia  . Thoracic aneurysm   . Unspecified sleep apnea     ROS:   All systems reviewed and negative except as noted in the HPI.   Past Surgical History:  Procedure Laterality Date  . CATARACT EXTRACTION Bilateral    bilateral  . COLONOSCOPY WITH PROPOFOL N/A 02/15/2015   Procedure: COLONOSCOPY WITH PROPOFOL;  Surgeon: Garlan Fair, MD;  Location: [facility];  Service: Endoscopy;  Laterality: N/A;  . CORONARY ARTERY BYPASS GRAFT  1990   LAD to RCA x2 vessel  . CORONARY ARTERY BYPASS GRAFT  1994   AVR with St. Jude and LIMA to LAD x1 vessel  . KNEE ARTHROSCOPY Bilateral    knee scope  . OTHER SURGICAL HISTORY     Aortic dissection with repair, AV resuspension, and CABG to LAD and RCA in 1990. AVR with St. Jude and LIMA to  LAD in 1994.  Marland Kitchen PACEMAKER INSERTION  12/07/10   DDD pacemaker, Dr. Caryl Comes     Family History  Problem Relation Age of Onset  . Cancer Mother     pancreatic  . Cancer Maternal Grandmother   . Heart attack Maternal Grandfather   . Heart attack Paternal Grandfather   . Migraines Daughter      Social History   Social History  . Marital status: Married    Spouse name: N/A  . Number of children: N/A  . Years of education: N/A   Occupational History  . Not on file.   Social History Main Topics  . Smoking status: Never Smoker  . Smokeless tobacco: Never Used  . Alcohol use Yes     Comment: Occasional, one fifth of liquor last 1.5 months, beer infrequently  . Drug use: No  . Sexual activity: Not on file   Other Topics Concern  . Not on file   Social History Narrative  . No narrative on file     BP (!) 157/91   Pulse 63   Ht 6\' 1"  (1.854 m)   Wt 291 lb 3.2 oz (132.1 kg)   SpO2 96%   BMI 38.42 kg/m   Physical Exam:  obese appearing 76yo man, NAD HEENT: Unremarkable Neck:  No JVD, no thyromegally Back:  No CVA tenderness Lungs:  Clear with no wheezes, rales, or rhonchi. HEART:  Regular rate rhythm, 2/6 systolic murmurs, no rubs, no clicks, mechanical S2 Abd:  soft, positive bowel sounds, no organomegally, no rebound, no guarding Ext:  2 plus pulses, trace peripheral edema, no cyanosis, no clubbing Skin:  No rashes no nodules Neuro:  CN II through XII intact, motor grossly intact  DEVICE  Normal device function.  See PaceArt for details.   Assess/Plan: 1. dyspnea - etiology is likely multifactorial. I have recommended he try and lose weight. 2. CHB - he has no escape today. He is pacing in the ventricle 96% of the time.His St. Jude device is working normally. 3. Obesity - he is frustrated by his inability to lose weight.  4. S/p aortic valve replacement - he has a soft systolic murmur and a crisp mechanical S2.   Mikle Bosworth.D.

## 2016-08-03 NOTE — Patient Instructions (Addendum)
Medication Instructions:  Your physician recommends that you continue on your current medications as directed. Please refer to the Current Medication list given to you today.   Labwork: None Ordered   Testing/Procedures: None Ordered   Follow-Up: Your physician wants you to follow-up in: 1 year with Dr. Lovena Le. You will receive a reminder letter in the mail two months in advance. If you don't receive a letter, please call our office to schedule the follow-up appointment.  Remote monitoring is used to monitor your Pacemaker from home. This monitoring reduces the number of office visits required to check your device to one time per year. It allows [place] to keep an eye on the functioning of your device to ensure it is working properly. You are scheduled for a device check from home on 11/05/16. You may send your transmission at any time that day. If you have a wireless device, the transmission will be sent automatically. After your physician reviews your transmission, you will receive a postcard with your next transmission date.    Any Other Special Instructions Will Be Listed Below (If Applicable).     If you need a refill on your cardiac medications before your next appointment, please call your pharmacy.

## 2016-08-24 ENCOUNTER — Other Ambulatory Visit: Payer: Self-pay | Admitting: Interventional Cardiology

## 2016-08-27 ENCOUNTER — Ambulatory Visit
Admission: RE | Admit: 2016-08-27 | Discharge: 2016-08-27 | Disposition: A | Discharge status: Home / Self Care | Payer: Medicare Other | Source: Ambulatory Visit | Attending: Internal Medicine | Admitting: Internal Medicine

## 2016-08-27 ENCOUNTER — Other Ambulatory Visit: Payer: Self-pay | Admitting: Internal Medicine

## 2016-08-27 DIAGNOSIS — R0602 Shortness of breath: Secondary | ICD-10-CM

## 2016-08-29 ENCOUNTER — Telehealth: Payer: Self-pay | Admitting: Interventional Cardiology

## 2016-08-29 DIAGNOSIS — I38 Endocarditis, valve unspecified: Secondary | ICD-10-CM

## 2016-08-29 DIAGNOSIS — I480 Paroxysmal atrial fibrillation: Secondary | ICD-10-CM

## 2016-08-29 DIAGNOSIS — I509 Heart failure, unspecified: Secondary | ICD-10-CM

## 2016-08-29 NOTE — Telephone Encounter (Signed)
Pt c/o Shortness Of Breath: STAT if SOB developed within the last 24 hours or pt is noticeably SOB on the phone  1. Are you currently SOB (can you hear that pt is SOB on the phone)? no  2. How long have you been experiencing SOB? A few months  3. Are you SOB when sitting or when up moving around? both  4. Are you currently experiencing any other symptoms? No chest pains   Patient is calling states that he had a visit with his family physician and as mentioned above he has SOB, BMP elevated to over 600. The physician took an xray and discovered fluid in his lungs and recommended an echo. Patient is calling to discuss, thanks.

## 2016-08-29 NOTE — Telephone Encounter (Addendum)
Pt states that he has been having random episodes of SOB for a few months.  They come and go and he can be sitting, standing, exerting, not exerting and it just presents.  Seen PCP on 08/27/16 and BNP was in the 600's.  Also had chest xray that pt states showed fluid in lungs.  Dr. Laurann Montana advised pt to increase Furosemide to 40mg  BID and to call him next week on how he is doing.  Pt does have a productive cough occasionally.  Feet swell at night, left worse than right.  Seen Dr. Lovena Le on 08/03/16 and weighed 291lbs, now weighs 285lbs. Pt states Dr. Laurann Montana had mentioned speaking to our office about possibly repeating an echo.  Pt's last echo was 08/2015.  Will route to Dr. Tamala Julian for review and advisement.  Copy of labs has been requested from Dr. Delene Ruffini office.

## 2016-08-30 NOTE — Telephone Encounter (Signed)
Spoke with pt and reviewed recommendations per Dr. Smith.  Pt verbalized understanding and was in agreement with this plan.  

## 2016-08-30 NOTE — Telephone Encounter (Signed)
Yes, do echo for CHF and needs 30 day monitor to r/o PAF.

## 2016-09-01 ENCOUNTER — Other Ambulatory Visit: Payer: Self-pay | Admitting: Interventional Cardiology

## 2016-09-09 DIAGNOSIS — R635 Abnormal weight gain: Secondary | ICD-10-CM | POA: Insufficient documentation

## 2016-09-10 ENCOUNTER — Encounter (INDEPENDENT_AMBULATORY_CARE_PROVIDER_SITE_OTHER): Payer: Self-pay

## 2016-09-10 ENCOUNTER — Ambulatory Visit (INDEPENDENT_AMBULATORY_CARE_PROVIDER_SITE_OTHER): Payer: Medicare Other

## 2016-09-10 ENCOUNTER — Encounter: Payer: Self-pay | Admitting: Interventional Cardiology

## 2016-09-10 ENCOUNTER — Ambulatory Visit (INDEPENDENT_AMBULATORY_CARE_PROVIDER_SITE_OTHER): Payer: Medicare Other | Admitting: Interventional Cardiology

## 2016-09-10 VITALS — BP 136/84 | HR 64 | Ht 73.0 in | Wt 286.8 lb

## 2016-09-10 DIAGNOSIS — Z5181 Encounter for therapeutic drug level monitoring: Secondary | ICD-10-CM

## 2016-09-10 DIAGNOSIS — I5042 Chronic combined systolic (congestive) and diastolic (congestive) heart failure: Secondary | ICD-10-CM

## 2016-09-10 DIAGNOSIS — I480 Paroxysmal atrial fibrillation: Secondary | ICD-10-CM

## 2016-09-10 DIAGNOSIS — I482 Chronic atrial fibrillation, unspecified: Secondary | ICD-10-CM

## 2016-09-10 DIAGNOSIS — Z950 Presence of cardiac pacemaker: Secondary | ICD-10-CM

## 2016-09-10 DIAGNOSIS — Z952 Presence of prosthetic heart valve: Secondary | ICD-10-CM | POA: Diagnosis not present

## 2016-09-10 DIAGNOSIS — I10 Essential (primary) hypertension: Secondary | ICD-10-CM

## 2016-09-10 DIAGNOSIS — I25708 Atherosclerosis of coronary artery bypass graft(s), unspecified, with other forms of angina pectoris: Secondary | ICD-10-CM

## 2016-09-10 LAB — BASIC METABOLIC PANEL
BUN / CREAT RATIO: 12 (ref 10–24)
BUN: 15 mg/dL (ref 8–27)
CO2: 25 mmol/L (ref 18–29)
CREATININE: 1.26 mg/dL (ref 0.76–1.27)
Calcium: 10.6 mg/dL — ABNORMAL HIGH (ref 8.6–10.2)
Chloride: 104 mmol/L (ref 96–106)
GFR calc Af Amer: 65 mL/min/{1.73_m2} (ref >59)
GFR, EST NON AFRICAN AMERICAN: 56 mL/min/{1.73_m2} — AB (ref >59)
Glucose: 117 mg/dL — ABNORMAL HIGH (ref 65–99)
Potassium: 3.9 mmol/L (ref 3.5–5.2)
SODIUM: 145 mmol/L — AB (ref 134–144)

## 2016-09-10 LAB — POCT INR: INR: 2.7

## 2016-09-10 LAB — PRO B NATRIURETIC PEPTIDE: NT-Pro BNP: 1374 pg/mL — ABNORMAL HIGH (ref 0–376)

## 2016-09-10 MED ORDER — FUROSEMIDE 80 MG PO TABS: 80.0000 mg | ORAL_TABLET | Freq: Every day | ORAL | 3 refills | Status: AC

## 2016-09-10 NOTE — Progress Notes (Addendum)
Cardiology Office Note    Date:  09/10/2016   ID:  Brent Everett, DOB Jan 07, 1941, MRN [id number]  PCP:  Irven Shelling, MD  Cardiologist: Sinclair Grooms, MD   Chief Complaint  Patient presents with  . Congestive Heart Failure  . Shortness of Breath    History of Present Illness:  Brent Everett is a 76 y.o. male presents for aortic dissection, status post repair with aortic valve replacement using mechanical aortic prosthesis, chronic combined systolic and diastolic heart failure, atrial fibrillation, tachycardia bradycardia syndrome, and permanent pacemaker.  Zoe is been dealing with taking care of the health of his wife who was recently diagnosed with multiple myeloma and has required chemotherapy. He noted increasing dyspnea on exertion and orthopnea. This was discussed with Dr. Laurann Montana and diuretic therapy was augmented. This led to dramatic improvement in complaints/symptoms. He has lost 6 or 7 pounds and feels her breathing is back to baseline. Dr. Laurann Montana increase the furosemide to 40 mg twice daily.   Past Medical History:  Diagnosis Date  . Allergic rhinitis   . Atrial enlargement, left    Severe, by ECHO 12/31/11  . Atrial fibrillation (HCC)    Asymptomatic. Continuous A fib on pacer check 10/02/11.  Marland Kitchen BPH (benign prostatic hyperplasia)   . Bradycardia    Severe  . CAD (coronary artery disease)    Signs of mild volume overload  . Cardiac pacemaker in situ 12/07/10   DDD pacemaker, Dr. Caryl Comes  . Colon adenoma 2009  . Colon polyp   . Coronary atherosclerosis of native coronary artery    Stable w/out angina  . Depressive disorder, not elsewhere classified   . DJD (degenerative joint disease) of cervical spine   . ED (erectile dysfunction)   . Esophageal reflux   . Heart valve replaced by other means    Normal function  . Hyperlipidemia   . Hypertension   . Hypogonadism male   . Obesity, unspecified   . OSA on CPAP    cpap used  . Precordial pain    R/O aortic aneurysm enlargement  . Presence of permanent cardiac pacemaker   . Right atrial enlargement    Moderate, by ECHO 12/31/11  . Second degree AV block    With asymptomatic severe bradycardia  . Thoracic aneurysm   . Unspecified sleep apnea     Past Surgical History:  Procedure Laterality Date  . CATARACT EXTRACTION Bilateral    bilateral  . COLONOSCOPY WITH PROPOFOL N/A 02/15/2015   Procedure: COLONOSCOPY WITH PROPOFOL;  Surgeon: Garlan Fair, MD;  Location: [facility];  Service: Endoscopy;  Laterality: N/A;  . CORONARY ARTERY BYPASS GRAFT  1990   LAD to RCA x2 vessel  . CORONARY ARTERY BYPASS GRAFT  1994   AVR with St. Jude and LIMA to LAD x1 vessel  . KNEE ARTHROSCOPY Bilateral    knee scope  . OTHER SURGICAL HISTORY     Aortic dissection with repair, AV resuspension, and CABG to LAD and RCA in 1990. AVR with St. Jude and LIMA to LAD in 1994.  Marland Kitchen PACEMAKER INSERTION  12/07/10   DDD pacemaker, Dr. Caryl Comes    Current Medications: Outpatient Medications Prior to Visit  Medication Sig Dispense Refill  . amLODipine (NORVASC) 10 MG tablet Take 10 mg by mouth daily.    Marland Kitchen atorvastatin (LIPITOR) 40 MG tablet Take 40 mg by mouth daily.    . carvedilol (COREG) 25 MG tablet TAKE 1 TABLET BY MOUTH  TWICE A DAY 60 tablet 11  . doxazosin (CARDURA) 8 MG tablet Take 1 tablet (8 mg total) by mouth daily. 30 tablet 10  . DULoxetine (CYMBALTA) 60 MG capsule Take 60 mg by mouth every morning.     Marland Kitchen eplerenone (INSPRA) 25 MG tablet take 1 tablet by mouth once daily 30 tablet 11  . Melatonin 3 MG TABS Take 1 tablet by mouth at bedtime.    . potassium chloride SA (K-DUR,KLOR-CON) 20 MEQ tablet take 1 tablet by mouth twice a day 60 tablet 11  . valsartan (DIOVAN) 320 MG tablet Take 320 mg by mouth every morning.     . warfarin (COUMADIN) 5 MG tablet TAKE AS DIRECTED BY COUMADIN CLINIC 50 tablet 3  . furosemide (LASIX) 40 MG tablet take 1 tablet by mouth once daily 30 tablet 5  .  cholecalciferol (VITAMIN D) 1000 UNITS tablet Take 1,000 Units by mouth daily.     No facility-administered medications prior to visit.      Allergies:   Codeine; Lotensin [benazepril hcl]; Other; and Sulfa antibiotics   Social History   Social History  . Marital status: Married    Spouse name: N/A  . Number of children: N/A  . Years of education: N/A   Social History Main Topics  . Smoking status: Never Smoker  . Smokeless tobacco: Never Used  . Alcohol use Yes     Comment: Occasional, one fifth of liquor last 1.5 months, beer infrequently  . Drug use: No  . Sexual activity: Not Asked   Other Topics Concern  . None   Social History Narrative  . None     Family History:  The patient's family history includes Cancer in his maternal grandmother and mother; Heart attack in his maternal grandfather and paternal grandfather; Migraines in his daughter.   ROS:   Please see the history of present illness.    Long discussion concerning his wife's illness. We also discussed his increase fluid intake. He drinks a lot of water every day to improve mouth dryness. He has had left greater than right lower extremity swelling. He has had orthopnea. These are improved. Weight is now 287 pounds down from 292 pounds when he last saw Dr. Laurann Montana. Weight last year this time was 302 pounds.  All other systems reviewed and are negative.   PHYSICAL EXAM:   VS:  BP 136/84 (BP Location: Left Arm)   Pulse 64   Ht '6\' 1"'$  (1.854 m)   Wt 286 lb 12.8 oz (130.1 kg)   BMI 37.84 kg/m    GEN: Well nourished, well developed, in no acute distress  HEENT: normal  Neck: no JVD, carotid bruits, or masses Cardiac: RRR; Mechanical valve closure sounds, 2/6 systolic murmur, no diastolic murmur. No gallop. Trace bilateral lower extremity edema slightly worse and left foot Respiratory:  clear to auscultation bilaterally, normal work of breathing GI: soft, nontender, nondistended, + BS MS: no deformity or atrophy   Skin: warm and dry, no rash Neuro:  Alert and Oriented x 3, Strength and sensation are intact Psych: euthymic mood, full affect  Wt Readings from Last 3 Encounters:  09/10/16 286 lb 12.8 oz (130.1 kg)  08/03/16 291 lb 3.2 oz (132.1 kg)  08/19/15 (!) 302 lb 12.8 oz (137.3 kg)      Studies/Labs Reviewed:   EKG:  EKG  Not repeated  Recent Labs: 05/16/2016: BUN 12; Creat 1.16; Potassium 3.7; Sodium 143   Lipid Panel No results found for:  CHOL, TRIG, HDL, CHOLHDL, VLDL, LDLCALC, LDLDIRECT  Additional studies/ records that were reviewed today include:  Echocardiogram performed 08/15/15: Study Conclusions  - Left ventricle: The cavity size was moderately dilated. Wall   thickness was increased in a pattern of mild LVH. EF now 45-50% compared with 55-60% prior. - Aortic valve: AV proxthesis is diffiuclt to see Peak and mean   graidnets through the valve are 40 and 25 mm Hg respectively. - Left atrium: The atrium was severely dilated. - Right ventricle: The cavity size was moderately dilated. Wall   thickness was normal. - Right atrium: The atrium was moderately to severely dilated.   ASSESSMENT:    1. Chronic combined systolic and diastolic heart failure ([place])   2. Coronary artery disease of bypass graft of native heart with stable angina pectoris ([place])   3. Hx of mechanical aortic valve replacement   4. Paroxysmal atrial fibrillation (HCC)   5. Essential hypertension   6. Pacemaker      PLAN:  In order of problems listed above:  1. Vital status is improved. Will switch furosemide to 80 mg daily. He is advised to wait daily. He is to notify us if there is greater than 3 per weight gain in 24 hours are greater than 5 pound weight gain in a week. These metrics would indicate that the transient diuretic adjustment is needed. We encouraged him to cut back on fluid intake. A basic metabolic panel and BNP are obtained today. Results will be forwarded to Dr. Lavone Orn. 2. Asymptomatic with reference to angina. 3. Normally functioning mechanical aortic valve. He has an upcoming echocardiogram already scheduled. 4. No evidence of atrial fibrillation 5. Reviewed blood pressures from all which have generally been in the 510-258 mmHg systolic range. No gross significant hypertension is noted. 6. Last checked within the 6 months prior to this office visit with normal function noted.  Plan to weigh daily and record. Notify us if greater than 3 pound weight gain in 1 day or 5 pounds in one week. He is to call if increasing dyspnea. Switch furosemide to 80 mg daily. Basic metabolic panel and BNP today.    Medication Adjustments/Labs and Tests Ordered: Current medicines are reviewed at length with the patient today.  Concerns regarding medicines are outlined above.  Medication changes, Labs and Tests ordered today are listed in the Patient Instructions below. Patient Instructions  Medication Instructions:  1) Furosemide '80mg'$  once daily  Labwork: BMET and BNP today  Testing/Procedures: None  Follow-Up: Your physician recommends that you schedule a follow-up appointment in: 3-4 months with Dr. Tamala Julian.   Any Other Special Instructions Will Be Listed Below (If Applicable).  Weigh daily.  If you see a 3-5 pound weight gain, please contact our office.     If you need a refill on your cardiac medications before your next appointment, please call your pharmacy.      Signed, Sinclair Grooms, MD  09/10/2016 10:25 AM    [address], [place], [place]  [address] Phone: [phone]; Fax: [phone]

## 2016-09-10 NOTE — Patient Instructions (Signed)
Medication Instructions:  1) Furosemide 80mg  once daily  Labwork: BMET and BNP today  Testing/Procedures: None  Follow-Up: Your physician recommends that you schedule a follow-up appointment in: 3-4 months with Dr. Tamala Julian.   Any Other Special Instructions Will Be Listed Below (If Applicable).  Weigh daily.  If you see a 3-5 pound weight gain, please contact our office.     If you need a refill on your cardiac medications before your next appointment, please call your pharmacy.

## 2016-09-13 ENCOUNTER — Other Ambulatory Visit: Payer: Self-pay | Admitting: Interventional Cardiology

## 2016-09-17 ENCOUNTER — Other Ambulatory Visit: Payer: Self-pay

## 2016-09-17 ENCOUNTER — Encounter (INDEPENDENT_AMBULATORY_CARE_PROVIDER_SITE_OTHER): Payer: Self-pay

## 2016-09-17 ENCOUNTER — Ambulatory Visit (HOSPITAL_COMMUNITY): Payer: Medicare Other | Attending: Cardiovascular Disease

## 2016-09-17 ENCOUNTER — Ambulatory Visit (INDEPENDENT_AMBULATORY_CARE_PROVIDER_SITE_OTHER): Payer: Medicare Other

## 2016-09-17 DIAGNOSIS — Z952 Presence of prosthetic heart valve: Secondary | ICD-10-CM | POA: Insufficient documentation

## 2016-09-17 DIAGNOSIS — E785 Hyperlipidemia, unspecified: Secondary | ICD-10-CM | POA: Insufficient documentation

## 2016-09-17 DIAGNOSIS — I509 Heart failure, unspecified: Secondary | ICD-10-CM | POA: Diagnosis not present

## 2016-09-17 DIAGNOSIS — I081 Rheumatic disorders of both mitral and tricuspid valves: Secondary | ICD-10-CM | POA: Diagnosis not present

## 2016-09-17 DIAGNOSIS — I38 Endocarditis, valve unspecified: Secondary | ICD-10-CM | POA: Diagnosis not present

## 2016-09-17 DIAGNOSIS — I480 Paroxysmal atrial fibrillation: Secondary | ICD-10-CM

## 2016-10-01 ENCOUNTER — Other Ambulatory Visit: Payer: Self-pay | Admitting: Interventional Cardiology

## 2016-10-03 ENCOUNTER — Telehealth: Payer: Self-pay | Admitting: Interventional Cardiology

## 2016-10-03 ENCOUNTER — Other Ambulatory Visit: Payer: Self-pay | Admitting: *Deleted

## 2016-10-03 MED ORDER — CARVEDILOL 25 MG PO TABS: ORAL_TABLET | ORAL | 3 refills | Status: DC

## 2016-10-03 NOTE — Telephone Encounter (Signed)
New message   *STAT* If patient is at the pharmacy, call can be transferred to refill team.   1. Which medications need to be refilled? (please list name of each medication and dose if known) Carvedilol 25mg    2. Which pharmacy/location (including street and city if local pharmacy) is medication to be sent to? [facility] [place]  3. Do they need a 30 day or 90 day supply? 90  Per pt states the pharmacy states the medication was not approved by Dr. Tamala Julian. Please call back to discuss

## 2016-10-03 NOTE — Telephone Encounter (Signed)
Returned patient's call about Carvedilol.  The medication was mistaken as already approved.  Rx for Carvedilol 25mg , twice daily by mouth was sent to [id number] with 3RF.  Verified with [facility] they received rx as Epic says.

## 2016-10-31 ENCOUNTER — Ambulatory Visit (INDEPENDENT_AMBULATORY_CARE_PROVIDER_SITE_OTHER): Payer: Medicare Other | Admitting: *Deleted

## 2016-10-31 DIAGNOSIS — Z952 Presence of prosthetic heart valve: Secondary | ICD-10-CM | POA: Diagnosis not present

## 2016-10-31 DIAGNOSIS — I480 Paroxysmal atrial fibrillation: Secondary | ICD-10-CM

## 2016-10-31 DIAGNOSIS — Z5181 Encounter for therapeutic drug level monitoring: Secondary | ICD-10-CM

## 2016-10-31 DIAGNOSIS — I482 Chronic atrial fibrillation, unspecified: Secondary | ICD-10-CM

## 2016-10-31 LAB — POCT INR: INR: 2.4

## 2016-11-02 ENCOUNTER — Other Ambulatory Visit: Payer: Self-pay | Admitting: Interventional Cardiology

## 2016-11-05 ENCOUNTER — Ambulatory Visit (INDEPENDENT_AMBULATORY_CARE_PROVIDER_SITE_OTHER): Payer: Medicare Other | Admitting: *Deleted

## 2016-11-05 DIAGNOSIS — I442 Atrioventricular block, complete: Secondary | ICD-10-CM | POA: Diagnosis not present

## 2016-11-06 NOTE — Progress Notes (Signed)
Remote pacemaker transmission.   

## 2016-11-07 LAB — CUP PACEART REMOTE DEVICE CHECK
Battery Remaining Longevity: 115 mo
Date Time Interrogation Session: 20180507060029
Implantable Lead Implant Date: 20120607
Implantable Lead Implant Date: 20120607
Implantable Lead Location: 753860
Implantable Pulse Generator Implant Date: 20120607
Lead Channel Impedance Value: 400 Ohm
Lead Channel Setting Pacing Amplitude: 0.875
Lead Channel Setting Pacing Pulse Width: 0.4 ms
Lead Channel Setting Sensing Sensitivity: 2 mV
MDC IDC LEAD LOCATION: 753859
MDC IDC MSMT BATTERY REMAINING PERCENTAGE: 81 %
MDC IDC MSMT BATTERY VOLTAGE: 2.93 V
MDC IDC MSMT LEADCHNL RV PACING THRESHOLD AMPLITUDE: 0.625 V
MDC IDC MSMT LEADCHNL RV PACING THRESHOLD PULSEWIDTH: 0.4 ms
MDC IDC MSMT LEADCHNL RV SENSING INTR AMPL: 12 mV
MDC IDC STAT BRADY RV PERCENT PACED: 90 %
Pulse Gen Serial Number: 7255840

## 2016-11-09 ENCOUNTER — Encounter: Payer: Self-pay | Admitting: Cardiology

## 2016-12-26 ENCOUNTER — Ambulatory Visit: Payer: Medicare Other | Admitting: Interventional Cardiology

## 2017-03-13 ENCOUNTER — Other Ambulatory Visit: Payer: Self-pay | Admitting: Interventional Cardiology

## 2017-03-13 NOTE — Telephone Encounter (Signed)
Medication Detail    Disp Refills Start End   furosemide (LASIX) 80 MG tablet 90 tablet 3 09/10/2016 12/09/2016   Sig - Route: Take 1 tablet (80 mg total) by mouth daily. - Oral   Sent to pharmacy as: furosemide (LASIX) 80 MG tablet   E-Prescribing Status: Receipt confirmed by pharmacy (09/10/2016 10:06 AM EDT)   Pharmacy   [facility] [place], [place]

## 2017-03-22 ENCOUNTER — Other Ambulatory Visit: Payer: Self-pay | Admitting: Interventional Cardiology

## 2017-05-13 IMAGING — CR DG CHEST 2V
2 series · 2 of 2 positions shown · non-contrast
Comparison: Chest x-ray of December 08, 2010

CLINICAL DATA: Wheezing and shortness of breath for the past 2
months. History of coronary artery disease with previous CABG.
Previous aortic dissection repair.

EXAM:
CHEST  2 VIEW

[w chest pa]
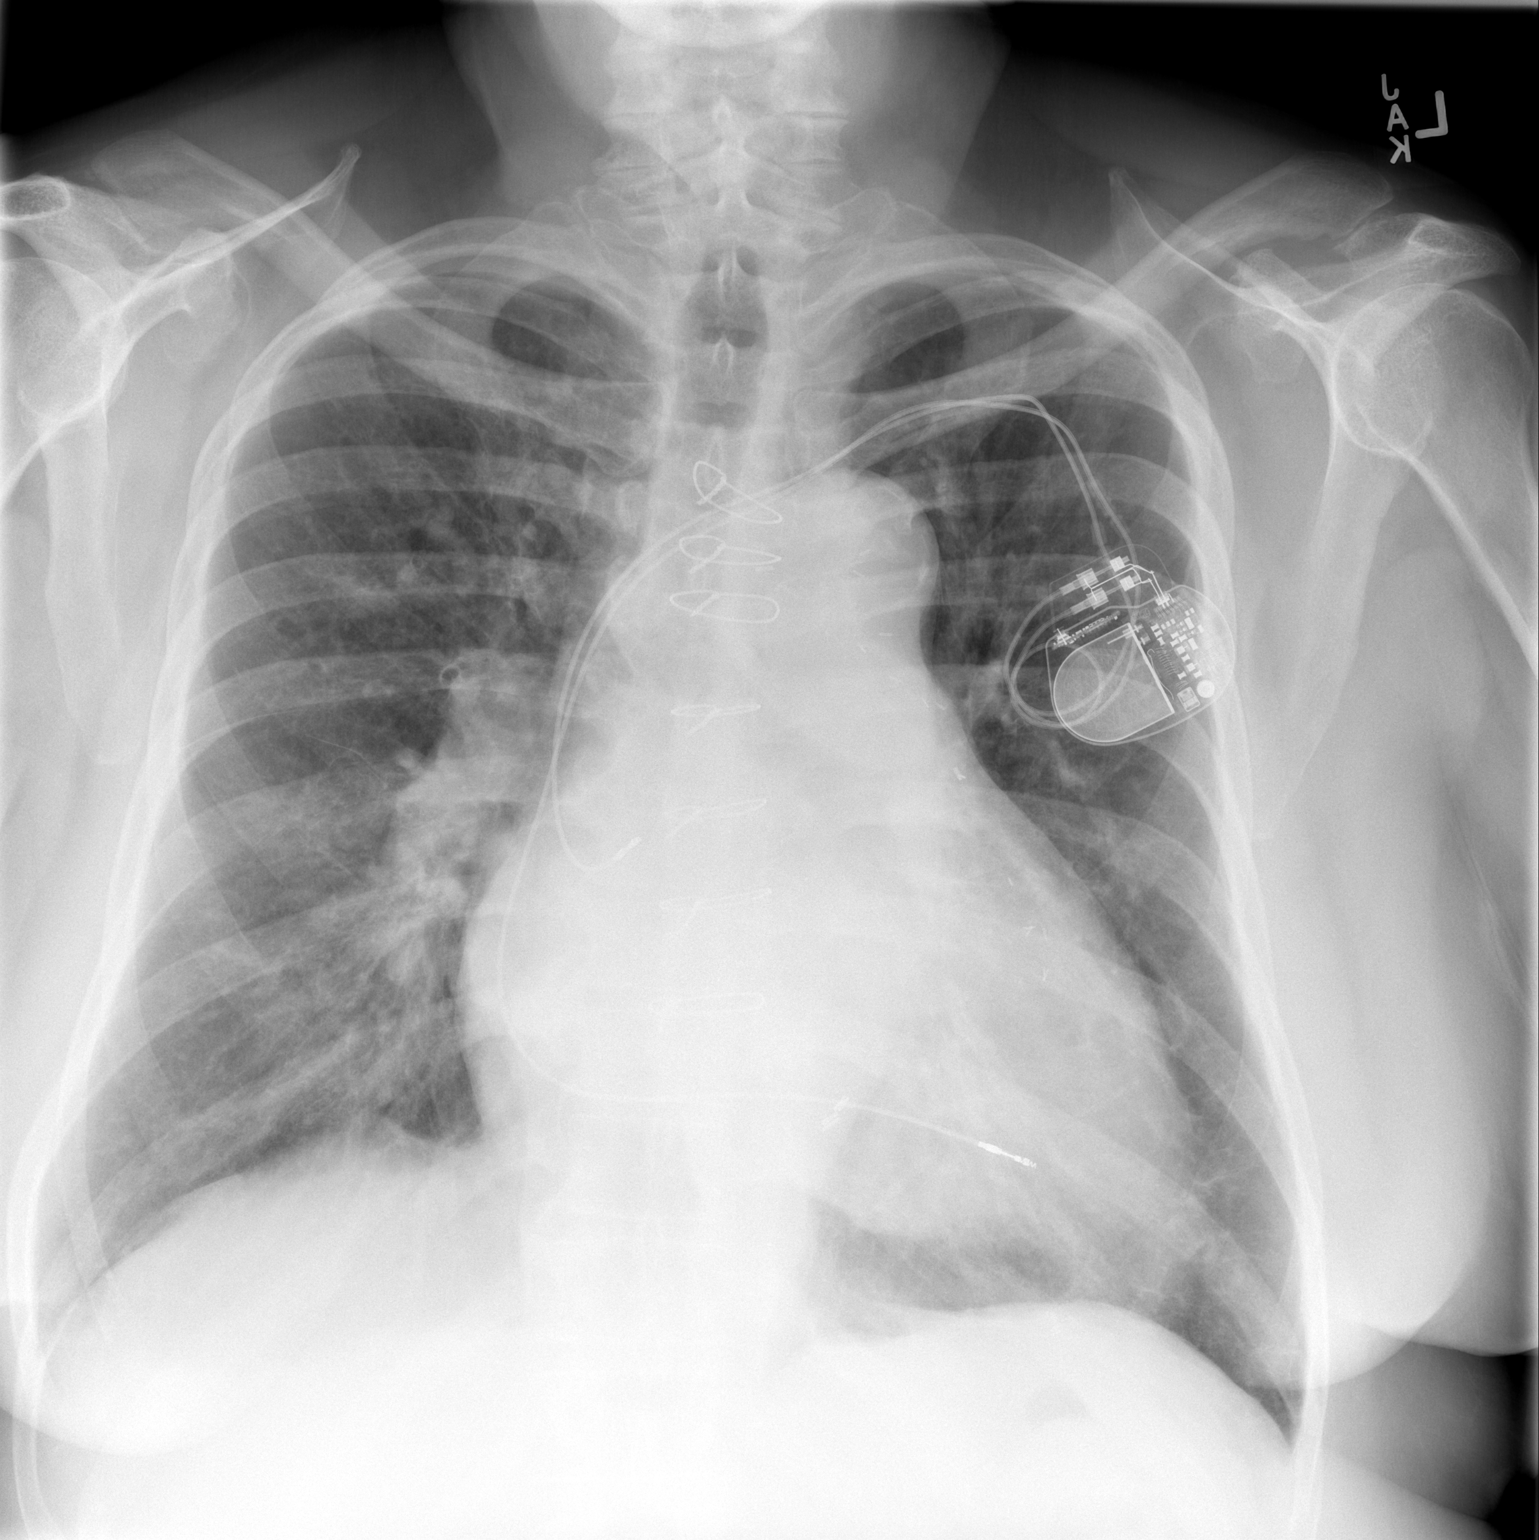

[w chest lat]
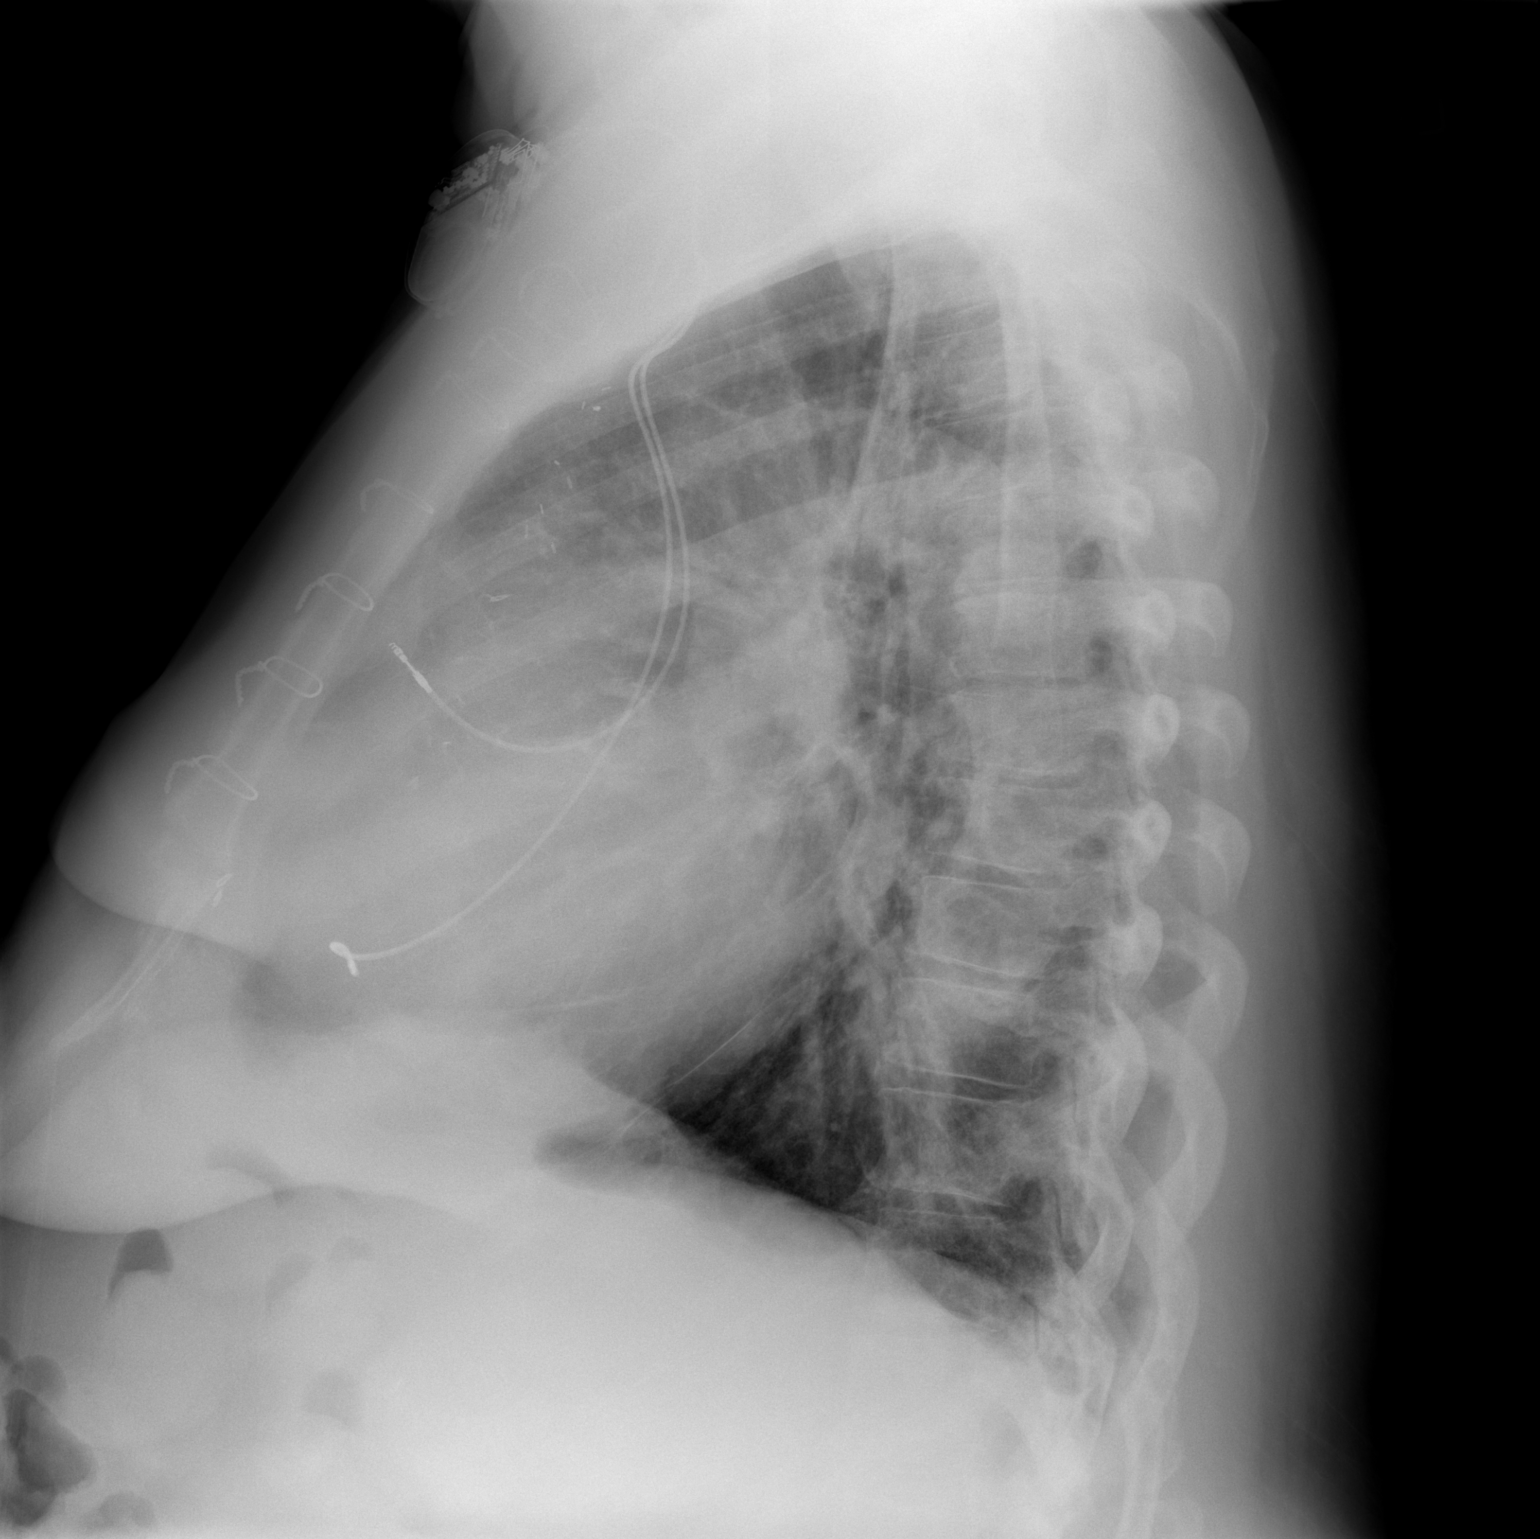

[2 of 2 positions shown; findings below may reference images not displayed]

FINDINGS: The lungs remain hyperinflated. The interstitial markings are
increased bilaterally. The pulmonary vascularity is more engorged.
The cardiac silhouette is enlarged. The ICD is in stable position.
The sternal wires appear intact. There is calcification in the wall
of the aortic arch. There is no pleural effusion. The bony thorax
exhibits no acute abnormality.
IMPRESSION: COPD with superimposed moderate CHF. There is no alveolar pneumonia.

Thoracic aortic atherosclerosis.

## 2017-08-03 ENCOUNTER — Encounter: Payer: Medicare Other | Admitting: Internal Medicine

## 2017-08-19 ENCOUNTER — Other Ambulatory Visit: Payer: Self-pay

## 2017-08-19 MED ORDER — EPLERENONE 25 MG PO TABS: 25.0000 mg | ORAL_TABLET | Freq: Every day | ORAL | 0 refills | Status: AC

## 2018-12-29 ENCOUNTER — Other Ambulatory Visit: Payer: Self-pay | Admitting: Interventional Cardiology

## 2019-03-23 ENCOUNTER — Other Ambulatory Visit: Payer: Self-pay | Admitting: Interventional Cardiology

## 2019-03-23 MED ORDER — CARVEDILOL 25 MG PO TABS: ORAL_TABLET | ORAL | 0 refills | Status: AC

## 2020-11-30 DEATH — deceased

## 2020-12-30 DEATH — deceased
